# Patient Record
Sex: Male | Born: 1975 | Race: White | Hispanic: No | Marital: Single | Smoking: Never smoker
Health system: Southern US, Community
[De-identification: ages and names within clinical notes are randomized; demographics above are authoritative.]

## PROBLEM LIST (undated history)

## (undated) DIAGNOSIS — IMO0002 Reserved for concepts with insufficient information to code with codable children: Secondary | ICD-10-CM

## (undated) HISTORY — PX: APPENDECTOMY: SHX54

## (undated) HISTORY — PX: ABDOMINAL SURGERY: SHX537

## (undated) HISTORY — PX: COSMETIC SURGERY: SHX468

---

## 2005-11-21 HISTORY — PX: GASTRIC BYPASS: SHX52

## 2014-08-09 ENCOUNTER — Encounter (HOSPITAL_COMMUNITY): Payer: Self-pay | Admitting: Emergency Medicine

## 2014-08-09 ENCOUNTER — Observation Stay (HOSPITAL_COMMUNITY)
Admission: EM | Admit: 2014-08-09 | Discharge: 2014-08-11 | Disposition: A | Discharge status: Home / Self Care | Payer: BC Managed Care – PPO | Attending: General Surgery | Admitting: General Surgery

## 2014-08-09 ENCOUNTER — Emergency Department (HOSPITAL_COMMUNITY): Payer: BC Managed Care – PPO

## 2014-08-09 DIAGNOSIS — S2239XA Fracture of one rib, unspecified side, initial encounter for closed fracture: Secondary | ICD-10-CM | POA: Diagnosis present

## 2014-08-09 DIAGNOSIS — Z9884 Bariatric surgery status: Secondary | ICD-10-CM | POA: Insufficient documentation

## 2014-08-09 DIAGNOSIS — W108XXA Fall (on) (from) other stairs and steps, initial encounter: Secondary | ICD-10-CM | POA: Diagnosis not present

## 2014-08-09 DIAGNOSIS — S270XXA Traumatic pneumothorax, initial encounter: Secondary | ICD-10-CM | POA: Diagnosis not present

## 2014-08-09 DIAGNOSIS — S27329A Contusion of lung, unspecified, initial encounter: Secondary | ICD-10-CM

## 2014-08-09 DIAGNOSIS — G935 Compression of brain: Secondary | ICD-10-CM | POA: Insufficient documentation

## 2014-08-09 DIAGNOSIS — S2249XA Multiple fractures of ribs, unspecified side, initial encounter for closed fracture: Principal | ICD-10-CM | POA: Insufficient documentation

## 2014-08-09 DIAGNOSIS — S2231XA Fracture of one rib, right side, initial encounter for closed fracture: Secondary | ICD-10-CM

## 2014-08-09 DIAGNOSIS — Z23 Encounter for immunization: Secondary | ICD-10-CM | POA: Diagnosis not present

## 2014-08-09 DIAGNOSIS — J939 Pneumothorax, unspecified: Secondary | ICD-10-CM

## 2014-08-09 DIAGNOSIS — W19XXXA Unspecified fall, initial encounter: Secondary | ICD-10-CM | POA: Diagnosis present

## 2014-08-09 DIAGNOSIS — Y92009 Unspecified place in unspecified non-institutional (private) residence as the place of occurrence of the external cause: Secondary | ICD-10-CM | POA: Insufficient documentation

## 2014-08-09 DIAGNOSIS — S2241XA Multiple fractures of ribs, right side, initial encounter for closed fracture: Secondary | ICD-10-CM | POA: Diagnosis present

## 2014-08-09 HISTORY — DX: Reserved for concepts with insufficient information to code with codable children: IMO0002

## 2014-08-09 LAB — CBC WITH DIFFERENTIAL/PLATELET
Basophils Absolute: 0 10*3/uL (ref 0.0–0.1)
Basophils Relative: 0 % (ref 0–1)
Eosinophils Absolute: 0 10*3/uL (ref 0.0–0.7)
Eosinophils Relative: 0 % (ref 0–5)
HCT: 45.3 % (ref 39.0–52.0)
HEMOGLOBIN: 15.5 g/dL (ref 13.0–17.0)
LYMPHS ABS: 0.7 10*3/uL (ref 0.7–4.0)
LYMPHS PCT: 6 % — AB (ref 12–46)
MCH: 30.9 pg (ref 26.0–34.0)
MCHC: 34.2 g/dL (ref 30.0–36.0)
MCV: 90.4 fL (ref 78.0–100.0)
MONOS PCT: 5 % (ref 3–12)
Monocytes Absolute: 0.6 10*3/uL (ref 0.1–1.0)
NEUTROS ABS: 9.7 10*3/uL — AB (ref 1.7–7.7)
NEUTROS PCT: 89 % — AB (ref 43–77)
PLATELETS: 195 10*3/uL (ref 150–400)
RBC: 5.01 MIL/uL (ref 4.22–5.81)
RDW: 13.4 % (ref 11.5–15.5)
WBC: 11 10*3/uL — AB (ref 4.0–10.5)

## 2014-08-09 LAB — BASIC METABOLIC PANEL
Anion gap: 15 (ref 5–15)
BUN: 12 mg/dL (ref 6–23)
CHLORIDE: 95 meq/L — AB (ref 96–112)
CO2: 25 mEq/L (ref 19–32)
Calcium: 9.1 mg/dL (ref 8.4–10.5)
Creatinine, Ser: 0.7 mg/dL (ref 0.50–1.35)
GFR calc Af Amer: >90 mL/min (ref >90)
GFR calc non Af Amer: >90 mL/min (ref >90)
GLUCOSE: 131 mg/dL — AB (ref 70–99)
POTASSIUM: 3.9 meq/L (ref 3.7–5.3)
Sodium: 135 mEq/L — ABNORMAL LOW (ref 137–147)

## 2014-08-09 MED ORDER — HYDROMORPHONE HCL 1 MG/ML IJ SOLN
1.0000 mg | INTRAMUSCULAR | Status: DC | PRN
  Administered 2014-08-09 – 2014-08-10 (×3): 1 mg via INTRAVENOUS
  Filled 2014-08-09 (×3): qty 1

## 2014-08-09 MED ORDER — IOHEXOL 300 MG/ML  SOLN
100.0000 mL | Freq: Once | INTRAMUSCULAR | Status: AC | PRN
  Administered 2014-08-09: 100 mL via INTRAVENOUS

## 2014-08-09 MED ORDER — HYDROMORPHONE HCL 1 MG/ML IJ SOLN
0.5000 mg | INTRAMUSCULAR | Status: DC | PRN
  Administered 2014-08-09 – 2014-08-10 (×2): 0.5 mg via INTRAVENOUS
  Filled 2014-08-09 (×2): qty 1

## 2014-08-09 MED ORDER — MORPHINE SULFATE 4 MG/ML IJ SOLN
4.0000 mg | Freq: Once | INTRAMUSCULAR | Status: AC
  Administered 2014-08-09: 4 mg via INTRAVENOUS
  Filled 2014-08-09: qty 1

## 2014-08-09 MED ORDER — DOCUSATE SODIUM 100 MG PO CAPS
100.0000 mg | ORAL_CAPSULE | Freq: Two times a day (BID) | ORAL | Status: DC
  Administered 2014-08-09 – 2014-08-11 (×4): 100 mg via ORAL
  Filled 2014-08-09 (×4): qty 1

## 2014-08-09 MED ORDER — OXYCODONE HCL 5 MG PO TABS
5.0000 mg | ORAL_TABLET | ORAL | Status: DC | PRN
  Administered 2014-08-10: 10 mg via ORAL
  Filled 2014-08-09: qty 2

## 2014-08-09 MED ORDER — SODIUM CHLORIDE 0.9 % IV BOLUS (SEPSIS)
1000.0000 mL | Freq: Once | INTRAVENOUS | Status: AC
  Administered 2014-08-09: 1000 mL via INTRAVENOUS

## 2014-08-09 MED ORDER — ENOXAPARIN SODIUM 40 MG/0.4ML ~~LOC~~ SOLN
40.0000 mg | SUBCUTANEOUS | Status: DC
  Administered 2014-08-09: 40 mg via SUBCUTANEOUS
  Filled 2014-08-09 (×2): qty 0.4

## 2014-08-09 MED ORDER — ONDANSETRON HCL 4 MG/2ML IJ SOLN: 4.0000 mg | Freq: Four times a day (QID) | INTRAMUSCULAR | Status: DC | PRN

## 2014-08-09 MED ORDER — ONDANSETRON HCL 4 MG PO TABS: 4.0000 mg | ORAL_TABLET | Freq: Four times a day (QID) | ORAL | Status: DC | PRN

## 2014-08-09 MED ORDER — HYDROMORPHONE HCL 1 MG/ML IJ SOLN
1.0000 mg | Freq: Once | INTRAMUSCULAR | Status: AC
  Administered 2014-08-09: 1 mg via INTRAVENOUS
  Filled 2014-08-09: qty 1

## 2014-08-09 MED ORDER — OXYCODONE-ACETAMINOPHEN 5-325 MG PO TABS
2.0000 | ORAL_TABLET | ORAL | Status: DC | PRN
  Administered 2014-08-09 – 2014-08-10 (×3): 2 via ORAL
  Filled 2014-08-09 (×3): qty 2

## 2014-08-09 MED ORDER — HYDROMORPHONE HCL 1 MG/ML IJ SOLN
0.5000 mg | Freq: Once | INTRAMUSCULAR | Status: AC
  Administered 2014-08-09: 0.5 mg via INTRAVENOUS
  Filled 2014-08-09: qty 1

## 2014-08-09 NOTE — ED Provider Notes (Signed)
CSN: [id number]     Arrival date & time 08/09/14  1025 History   First MD Initiated Contact with Patient 08/09/14 1139     Chief Complaint  Patient presents with  . Rib Injury     (Consider location/radiation/quality/duration/timing/severity/associated sxs/prior Treatment) HPI Comments: The patient is a 38 year old male past medical history of Chiari malformation, gastric bypass presenting to emergency room from outpatient ortho clinic with a chief complaint of right lower rib pain since last night. Outside x-rays show multiple rib fractures. The patient reports he tripped while walking down the stairs and fell on to the steps on his right side at approximately 2330 last night. He denies blow to head or loss of consciousness. Denies abdominal pain, hematuria.  He reports movement and deep breathing increase pain. Reports shortness of breath with movement.  The history is provided by the patient. No language interpreter was used.    Past Medical History  Diagnosis Date  . Chiari malformation    Past Surgical History  Procedure Laterality Date  . Abdominal surgery     History reviewed. No pertinent family history. History  Substance Use Topics  . Smoking status: Never Smoker   . Smokeless tobacco: Not on file  . Alcohol Use: Yes    Review of Systems  Constitutional: Negative for fever and chills.  Respiratory: Positive for shortness of breath.   Cardiovascular: Positive for chest pain.  Gastrointestinal: Negative for vomiting and abdominal pain.  Genitourinary: Negative for hematuria.  Neurological: Negative for syncope.      Allergies  Review of patient's allergies indicates no known allergies.  Home Medications   Prior to Admission medications   Not on File   BP 133/82  Pulse 61  Temp(Src) 98.1 F (36.7 C) (Oral)  Resp 19  Ht 6' (1.829 m)  Wt 210 lb (95.255 kg)  BMI 28.47 kg/m2  SpO2 95% Physical Exam  Nursing note and vitals reviewed. Constitutional: He  is oriented to person, place, and time. He appears well-developed and well-nourished.  HENT:  Head: Normocephalic and atraumatic.  Eyes: EOM are normal.  Neck: Neck supple.  Cardiovascular: Normal rate and regular rhythm.   Pulmonary/Chest: No respiratory distress. He has decreased breath sounds in the right lower field. He has no wheezes. He has no rhonchi. He exhibits tenderness and bony tenderness.    Patient is able to speak in complete sentences.   Abdominal: Soft. There is no tenderness. There is no rebound and no guarding.  Musculoskeletal: Normal range of motion.  Neurological: He is alert and oriented to person, place, and time.  Skin: Skin is warm and dry. He is not diaphoretic.  Small ecchymosis to right lower extremity.   Psychiatric: He has a normal mood and affect. His behavior is normal.    ED Course  Procedures (including critical care time) Labs Review Labs Reviewed  CBC WITH DIFFERENTIAL - Abnormal; Notable for the following:    WBC 11.0 (*)    Neutrophils Relative % 89 (*)    Neutro Abs 9.7 (*)    Lymphocytes Relative 6 (*)    All other components within normal limits  BASIC METABOLIC PANEL - Abnormal; Notable for the following:    Sodium 135 (*)    Chloride 95 (*)    Glucose, Bld 131 (*)    All other components within normal limits    Imaging Review Ct Chest W Contrast  08/09/2014   CLINICAL DATA:  Fall.  Right chest wall pain.  Difficulty  breathing.  EXAM: CT CHEST WITH CONTRAST  TECHNIQUE: Multidetector CT imaging of the chest was performed during intravenous contrast administration.  CONTRAST:  170mL OMNIPAQUE IOHEXOL 300 MG/ML  SOLN  COMPARISON:  None.  FINDINGS: There are displaced fractures of the right sixth, seventh and eighth ribs. The sixth rib fracture is lateral, displaced 1 shaft width and overlapped by 13 mm. The seventh rib fracture is displaced by 1 shaft with and overlapped by 2 cm. It is also lateral. The eighth rib fracture is posterior  lateral, displaced 1 full shaft with and only slightly overlapped, by 5 mm. The medial fracture components are displaced centrally indenting the underlying lung. There are additional fractures of the posterior medial seventh and eighth ribs, nondisplaced.  There is a small pneumothorax, approximate 10%. There is patchy opacity in the right lower lobe consistent with a combination of pulmonary contusion and atelectasis. A small right pleural effusion is noted dependently and along the posterior hemidiaphragmatic surface.  No other fractures. Left lung is clear. There is no left pneumothorax or pleural effusion.  Heart is normal in size and configuration. Great vessels are normal in caliber. No mediastinal mass or adenopathy. No hematoma. No hilar masses or adenopathy.  No neck base or axillary masses or adenopathy.  Limited evaluation of the upper abdomen shows no evidence of a liver laceration or contusion. There are changes from previous gastric stapling surgery.  Small amount of subcutaneous air extends into the soft tissues below the chest wall musculature adjacent to the rib fractures.  IMPRESSION: 1. Displaced right sixth, seventh and eighth rib fractures as described associated with contusion and atelectasis of portions of the right lower lobe, a small effusion and a small pneumothorax. 2. No other acute, traumatic findings.   Electronically Signed   By: Lajean Manes M.D.   On: 08/09/2014 14:21     EKG Interpretation None      MDM   Final diagnoses:  Traumatic closed displaced fracture of rib on right side, initial encounter  Lung contusion, initial encounter  Pneumothorax on right   Patient sent by orthopedist for further evaluation of multiple rib fractures, outside x-rays show 3) fractures, no obvious lung injury, questionable subcutaneous air. Dr. Alvino Chapel also evaluated the patient during this encounter and advises CT of the chest. CT shows 3 displaced rib fractures,  with associated  small pneumothorax proximally 10% and pulmonary contusion with atelectasis. A small right pleural effusion also noted.  Paged trauma. Reevaluation patient reports 5/10 discomfort if sitting still. Discuss CT findings and advises admission for pain control and observation. Patient agrees to admission. 3:25 PM Discussed patient history, condition, CT with Dr. Brantley Stage, who agrees with admission and will have a trauma PA evaluate the patient. Meds given in ED:  Medications  morphine 4 MG/ML injection 4 mg (4 mg Intravenous Given 08/09/14 1220)  sodium chloride 0.9 % bolus 1,000 mL (0 mLs Intravenous Stopped 08/09/14 1413)  HYDROmorphone (DILAUDID) injection 0.5 mg (0.5 mg Intravenous Given 08/09/14 1325)  iohexol (OMNIPAQUE) 300 MG/ML solution 100 mL (100 mLs Intravenous Contrast Given 08/09/14 1352)  HYDROmorphone (DILAUDID) injection 1 mg (1 mg Intravenous Given 08/09/14 1414)    New Prescriptions   No medications on file    Harvie Heck, PA-C 08/09/14 1535

## 2014-08-09 NOTE — ED Notes (Signed)
Pt returned from CT °

## 2014-08-09 NOTE — ED Notes (Addendum)
He fell last night over a  Bucket in garage. hes had R sided rib pain since and [facility] did CXR in office today. They sent him for further eval of 3 broken ribs on cxr. He states hes had some SOB and it hurts to breathe since the injury. He has CXR on cd with him

## 2014-08-09 NOTE — ED Notes (Signed)
Trauma at bedside.

## 2014-08-09 NOTE — H&P (Signed)
Tanner Carroll is an 38 y.o. male.   Chief Complaint: Fall HPI: Tanner Carroll has imbalance 2/2 Chiari I malformation and fell down some stairs into the garage striking his right side on a parked vehicle. There was no loss of consciousness. He put up with the pain until this morning when he had his aunt and uncle bring him to urgent care. A chest x-ray showed rib fractures and he was sent to the ED.  Past Medical History  Diagnosis Date  . Chiari malformation     Past Surgical History  Procedure Laterality Date  . Abdominal surgery      History reviewed. No pertinent family history. Social History:  reports that he has never smoked. He does not have any smokeless tobacco history on file. He reports that he drinks alcohol. He reports that he does not use illicit drugs.  Allergies:  Allergies  Allergen Reactions  . Ibuprofen Other (See Comments)    Gastric bypass surgery, cannot have  . Nsaids Other (See Comments)    Gastric bypass surgery, cannot have    Results for orders placed during the hospital encounter of 08/09/14 (from the past 48 hour(s))  CBC WITH DIFFERENTIAL     Status: Abnormal   Collection Time    08/09/14 12:31 PM      Result Value Ref Range   WBC 11.0 (*) 4.0 - 10.5 K/uL   RBC 5.01  4.22 - 5.81 MIL/uL   Hemoglobin 15.5  13.0 - 17.0 g/dL   HCT 45.3  39.0 - 52.0 %   MCV 90.4  78.0 - 100.0 fL   MCH 30.9  26.0 - 34.0 pg   MCHC 34.2  30.0 - 36.0 g/dL   RDW 13.4  11.5 - 15.5 %   Platelets 195  150 - 400 K/uL   Neutrophils Relative % 89 (*) 43 - 77 %   Neutro Abs 9.7 (*) 1.7 - 7.7 K/uL   Lymphocytes Relative 6 (*) 12 - 46 %   Lymphs Abs 0.7  0.7 - 4.0 K/uL   Monocytes Relative 5  3 - 12 %   Monocytes Absolute 0.6  0.1 - 1.0 K/uL   Eosinophils Relative 0  0 - 5 %   Eosinophils Absolute 0.0  0.0 - 0.7 K/uL   Basophils Relative 0  0 - 1 %   Basophils Absolute 0.0  0.0 - 0.1 K/uL  BASIC METABOLIC PANEL     Status: Abnormal   Collection Time    08/09/14 12:31 PM       Result Value Ref Range   Sodium 135 (*) 137 - 147 mEq/L   Potassium 3.9  3.7 - 5.3 mEq/L   Chloride 95 (*) 96 - 112 mEq/L   CO2 25  19 - 32 mEq/L   Glucose, Bld 131 (*) 70 - 99 mg/dL   BUN 12  6 - 23 mg/dL   Creatinine, Ser 0.70  0.50 - 1.35 mg/dL   Calcium 9.1  8.4 - 10.5 mg/dL   GFR calc non Af Amer >90  >90 mL/min   GFR calc Af Amer >90  >90 mL/min   Comment: (NOTE)     The eGFR has been calculated using the CKD EPI equation.     This calculation has not been validated in all clinical situations.     eGFR's persistently <90 mL/min signify possible Chronic Kidney     Disease.   Anion gap 15  5 - 15   Ct Chest W Contrast  08/09/2014   CLINICAL DATA:  Fall.  Right chest wall pain.  Difficulty breathing.  EXAM: CT CHEST WITH CONTRAST  TECHNIQUE: Multidetector CT imaging of the chest was performed during intravenous contrast administration.  CONTRAST:  177m OMNIPAQUE IOHEXOL 300 MG/ML  SOLN  COMPARISON:  None.  FINDINGS: There are displaced fractures of the right sixth, seventh and eighth ribs. The sixth rib fracture is lateral, displaced 1 shaft width and overlapped by 13 mm. The seventh rib fracture is displaced by 1 shaft with and overlapped by 2 cm. It is also lateral. The eighth rib fracture is posterior lateral, displaced 1 full shaft with and only slightly overlapped, by 5 mm. The medial fracture components are displaced centrally indenting the underlying lung. There are additional fractures of the posterior medial seventh and eighth ribs, nondisplaced.  There is a small pneumothorax, approximate 10%. There is patchy opacity in the right lower lobe consistent with a combination of pulmonary contusion and atelectasis. A small right pleural effusion is noted dependently and along the posterior hemidiaphragmatic surface.  No other fractures. Left lung is clear. There is no left pneumothorax or pleural effusion.  Heart is normal in size and configuration. Great vessels are normal in caliber. No  mediastinal mass or adenopathy. No hematoma. No hilar masses or adenopathy.  No neck base or axillary masses or adenopathy.  Limited evaluation of the upper abdomen shows no evidence of a liver laceration or contusion. There are changes from previous gastric stapling surgery.  Small amount of subcutaneous air extends into the soft tissues below the chest wall musculature adjacent to the rib fractures.  IMPRESSION: 1. Displaced right sixth, seventh and eighth rib fractures as described associated with contusion and atelectasis of portions of the right lower lobe, a small effusion and a small pneumothorax. 2. No other acute, traumatic findings.   Electronically Signed   By: DLajean ManesM.D.   On: 08/09/2014 14:21    Review of Systems  Constitutional: Negative for weight loss.  HENT: Negative for ear discharge, ear pain, hearing loss and tinnitus.   Eyes: Negative for blurred vision, double vision, photophobia and pain.  Respiratory: Positive for shortness of breath. Negative for cough and sputum production.   Cardiovascular: Positive for chest pain.  Gastrointestinal: Negative for nausea, vomiting and abdominal pain.  Genitourinary: Negative for dysuria, urgency, frequency and flank pain.  Musculoskeletal: Negative for back pain, falls, joint pain, myalgias and neck pain.  Neurological: Negative for dizziness, tingling, sensory change, focal weakness, loss of consciousness and headaches.  Endo/Heme/Allergies: Does not bruise/bleed easily.  Psychiatric/Behavioral: Negative for depression, memory loss and substance abuse. The patient is not nervous/anxious.     Blood pressure 132/85, pulse 79, temperature 98.1 F (36.7 C), temperature source Oral, resp. rate 20, height 6' (1.829 m), weight 210 lb (95.255 kg), SpO2 97.00%. Physical Exam  Vitals reviewed. Constitutional: He is oriented to person, place, and time. He appears well-developed and well-nourished. He is cooperative. No distress. Cervical  collar and nasal cannula in place.  HENT:  Head: Normocephalic and atraumatic. Head is without raccoon's eyes, without Battle's sign, without abrasion, without contusion and without laceration.  Right Ear: Hearing, tympanic membrane, external ear and ear canal normal. No lacerations. No drainage or tenderness. No foreign bodies. Tympanic membrane is not perforated. No hemotympanum.  Left Ear: Hearing, tympanic membrane, external ear and ear canal normal. No lacerations. No drainage or tenderness. No foreign bodies. Tympanic membrane is not perforated. No hemotympanum.  Nose: Nose normal.  No nose lacerations, sinus tenderness, nasal deformity or nasal septal hematoma. No epistaxis.  Mouth/Throat: Uvula is midline, oropharynx is clear and moist and mucous membranes are normal. No lacerations. No oropharyngeal exudate.  Eyes: Conjunctivae, EOM and lids are normal. Pupils are equal, round, and reactive to light. Right eye exhibits no discharge. Left eye exhibits no discharge. No scleral icterus.  Neck: Trachea normal and normal range of motion. Neck supple. No JVD present. No spinous process tenderness and no muscular tenderness present. Carotid bruit is not present. No tracheal deviation present. No thyromegaly present.  Cardiovascular: Normal rate, regular rhythm, normal heart sounds, intact distal pulses and normal pulses.  Exam reveals no gallop and no friction rub.   No murmur heard. Respiratory: Effort normal and breath sounds normal. No stridor. No respiratory distress. He has no wheezes. He has no rales. He exhibits tenderness. He exhibits no bony tenderness, no laceration and no crepitus.  GI: Soft. Normal appearance and bowel sounds are normal. He exhibits no distension. There is no tenderness. There is no rigidity, no rebound, no guarding and no CVA tenderness.  Musculoskeletal: Normal range of motion. He exhibits no edema and no tenderness.  Lymphadenopathy:    He has no cervical adenopathy.   Neurological: He is alert and oriented to person, place, and time. He has normal strength. No cranial nerve deficit or sensory deficit. GCS eye subscore is 4. GCS verbal subscore is 5. GCS motor subscore is 6.  Skin: Skin is warm, dry and intact. He is not diaphoretic.  Psychiatric: He has a normal mood and affect. His speech is normal and behavior is normal.     Assessment/Plan Fall Multiple right rib fxs w/PTX -- Pulmonary toilet  Admit to trauma    Lisette Abu, PA-C Pager: [phone] General Trauma PA Pager: [phone] 08/09/2014, 4:11 PM

## 2014-08-09 NOTE — H&P (Signed)
Pt with small right PTX and no SOB.  Admit for 24 hours and repeat CXR in am.

## 2014-08-10 ENCOUNTER — Observation Stay (HOSPITAL_COMMUNITY): Payer: BC Managed Care – PPO

## 2014-08-10 DIAGNOSIS — W19XXXA Unspecified fall, initial encounter: Secondary | ICD-10-CM | POA: Diagnosis present

## 2014-08-10 DIAGNOSIS — S2241XA Multiple fractures of ribs, right side, initial encounter for closed fracture: Secondary | ICD-10-CM | POA: Diagnosis present

## 2014-08-10 DIAGNOSIS — G935 Compression of brain: Secondary | ICD-10-CM | POA: Diagnosis present

## 2014-08-10 MED ORDER — INFLUENZA VAC SPLIT QUAD 0.5 ML IM SUSY
0.5000 mL | PREFILLED_SYRINGE | INTRAMUSCULAR | Status: AC
  Administered 2014-08-11: 0.5 mL via INTRAMUSCULAR
  Filled 2014-08-10: qty 0.5

## 2014-08-10 MED ORDER — OXYCODONE HCL 5 MG PO TABS
10.0000 mg | ORAL_TABLET | ORAL | Status: DC | PRN
  Administered 2014-08-10 – 2014-08-11 (×4): 10 mg via ORAL
  Filled 2014-08-10 (×4): qty 2

## 2014-08-10 MED ORDER — HYDROMORPHONE HCL 1 MG/ML IJ SOLN
1.0000 mg | INTRAMUSCULAR | Status: DC | PRN
  Administered 2014-08-11: 1 mg via INTRAVENOUS
  Filled 2014-08-10: qty 1

## 2014-08-10 MED ORDER — TRAMADOL HCL 50 MG PO TABS
100.0000 mg | ORAL_TABLET | Freq: Four times a day (QID) | ORAL | Status: DC
  Administered 2014-08-10 – 2014-08-11 (×5): 100 mg via ORAL
  Filled 2014-08-10 (×5): qty 2

## 2014-08-10 MED ORDER — POLYETHYLENE GLYCOL 3350 17 G PO PACK
17.0000 g | PACK | Freq: Every day | ORAL | Status: DC
  Administered 2014-08-10: 17 g via ORAL
  Filled 2014-08-10 (×3): qty 1

## 2014-08-10 MED ORDER — METHOCARBAMOL 1000 MG/10ML IJ SOLN
1000.0000 mg | Freq: Four times a day (QID) | INTRAVENOUS | Status: DC | PRN
  Administered 2014-08-11 (×3): 1000 mg via INTRAVENOUS
  Filled 2014-08-10 (×4): qty 10

## 2014-08-10 MED ORDER — ENOXAPARIN SODIUM 30 MG/0.3ML ~~LOC~~ SOLN
30.0000 mg | Freq: Two times a day (BID) | SUBCUTANEOUS | Status: DC
  Administered 2014-08-10 – 2014-08-11 (×3): 30 mg via SUBCUTANEOUS
  Filled 2014-08-10 (×4): qty 0.3

## 2014-08-10 MED ORDER — PNEUMOCOCCAL VAC POLYVALENT 25 MCG/0.5ML IJ INJ
0.5000 mL | INJECTION | INTRAMUSCULAR | Status: AC
  Administered 2014-08-11: 0.5 mL via INTRAMUSCULAR
  Filled 2014-08-10: qty 0.5

## 2014-08-10 NOTE — Progress Notes (Signed)
Patient doing okay.  Has 10-15% PTX on CXR today.  Will repeat tomorrow AM.\ \ This patient has been seen and I agree with the findings and treatment plan.  Kathryne Eriksson. Dahlia Bailiff, MD, [place] [phone] (pager) [phone] (direct pager) Trauma Surgeon

## 2014-08-10 NOTE — Evaluation (Signed)
Physical Therapy Evaluation Patient Details Name: Tanner Carroll MRN: [id number] DOB: [birth year] Today's Date: 08/10/2014   History of Present Illness  38 y.o. male with imbalance 2/2 Chiari I malformation who fell coming down stairs sustaining R rib fxs and traumatic pneumothorax.  Clinical Impression  Pt admitted with above dx.  On eval, he is mod I for bed mobility and transfers.  Min guard assist needed for ambulation without A.D. 350.  No skilled PT intervention indicated.  Recommend pt ambulate in hallway with nursing assist.  PT signing off.    Follow Up Recommendations No PT follow up    Equipment Recommendations  None recommended by PT    Recommendations for Other Services       Precautions / Restrictions        Mobility  Bed Mobility Overal bed mobility: Modified Independent                Transfers Overall transfer level: Modified independent Equipment used: None                Ambulation/Gait Ambulation/Gait assistance: Min guard Ambulation Distance (Feet): 350 Feet Assistive device: None Gait Pattern/deviations: Decreased stride length;Step-through pattern Gait velocity: decreased Gait velocity interpretation: Below normal speed for age/gender General Gait Details: Pt requires focuses on object in front of him to maintain balance during ambulation.  He is independent with strategies needed to accomedate for balance deficits related to imbalance 2/2 Chiari I malformation.  Stairs Stairs: Yes Stairs assistance: Min guard Stair Management: One rail Right;Forwards;Alternating pattern Number of Stairs: 5 (x 2)    Wheelchair Mobility    Modified Rankin (Stroke Patients Only)       Balance                                             Pertinent Vitals/Pain Pain Assessment: 0-10 Pain Score: 4  Pain Location: R flank Pain Intervention(s): Monitored during session;Premedicated before session    Home Living Family/patient  expects to be discharged to:: Private residence Living Arrangements: Other relatives Available Help at Discharge: Family;Available 24 hours/day Type of Home: House Home Access: Stairs to enter Entrance Stairs-Rails: Right Entrance Stairs-Number of Steps: 5 Home Layout: Two level;Bed/bath upstairs Home Equipment: None      Prior Function Level of Independence: Independent               Hand Dominance        Extremity/Trunk Assessment   Upper Extremity Assessment: Overall WFL for tasks assessed           Lower Extremity Assessment: Overall WFL for tasks assessed      Cervical / Trunk Assessment: Normal  Communication   Communication: No difficulties  Cognition Arousal/Alertness: Awake/alert Behavior During Therapy: WFL for tasks assessed/performed Overall Cognitive Status: Within Functional Limits for tasks assessed                      General Comments      Exercises        Assessment/Plan    PT Assessment Patent does not need any further PT services  PT Diagnosis     PT Problem List    PT Treatment Interventions     PT Goals (Current goals can be found in the Care Plan section) Acute Rehab PT Goals Patient Stated Goal: not stated PT Goal Formulation: No  goals set, d/c therapy    Frequency     Barriers to discharge        Co-evaluation               End of Session Equipment Utilized During Treatment: Gait belt Activity Tolerance: Patient tolerated treatment well Patient left: with call bell/phone within reach;with family/visitor present (in bathroom) Nurse Communication: Mobility status    Functional Assessment Tool Used: clinical judgement Functional Limitation: Mobility: Walking and moving around Mobility: Walking and Moving Around Current Status ([id number]): At least 1 percent but less than 20 percent impaired, limited or restricted Mobility: Walking and Moving Around Goal Status [phone]): At least 1 percent but less than 20  percent impaired, limited or restricted Mobility: Walking and Moving Around Discharge Status [id number]): At least 1 percent but less than 20 percent impaired, limited or restricted    Time: 0933-0947 PT Time Calculation (min): 14 min   Charges:   PT Evaluation $Initial PT Evaluation Tier I: 1 Procedure PT Treatments $Gait Training: 8-22 mins   PT G Codes:   Functional Assessment Tool Used: clinical judgement Functional Limitation: Mobility: Walking and moving around    Lorriane Shire 08/10/2014, 12:53 PM

## 2014-08-10 NOTE — Progress Notes (Signed)
Patient ID: Tanner Carroll, male   DOB: 24-Jul-1976, 38 y.o.   MRN: [id number]   LOS: 1 day   Subjective: Feeling better but not moving much. SOB when mobilizing.   Objective: Vital signs in last 24 hours: Temp:  [98.1 F (36.7 C)-98.5 F (36.9 C)] 98.1 F (36.7 C) (09/20 0626) Pulse Rate:  [56-93] 56 (09/20 0626) Resp:  [16-24] 18 (09/20 0626) BP: (121-147)/(56-85) 123/67 mmHg (09/20 0626) SpO2:  [83 %-100 %] 97 % (09/20 0626) Weight:  [210 lb (95.255 kg)] 210 lb (95.255 kg) (09/19 1035) Last BM Date: 08/08/14   Radiology Results PORTABLE CHEST - 1 VIEW  COMPARISON: CT chest yesterday.  FINDINGS:  Cardiomediastinal silhouette unremarkable, unchanged. Right apical  and lateral pneumothorax slightly increased since yesterday.  Displaced right rib fractures as detailed on the CT. Stable  atelectasis at the right lung base. No new pulmonary parenchymal  abnormalities.  IMPRESSION:  1. Slight interval increase in size of the right apical and lateral  pneumothorax since yesterday.  2. Stable right basilar atelectasis.  3. No new abnormalities.  Electronically Signed  By: Evangeline Dakin M.D.  On: 08/10/2014 08:56   Physical Exam General appearance: alert and no distress Resp: clear to auscultation bilaterally Cardio: regular rate and rhythm GI: normal findings: bowel sounds normal and soft, non-tender   Assessment/Plan: Fall Multiple right rib fxs w/PTX -- Though read as worse today's x-ray appears stable to slightly improved compared with CXR done at [facility] yesterday. FEN -- Add tramadol VTE -- SCD's, Lovenox Dispo -- Will see how day goes with PT eval, may need 1 more day    Lisette Abu, PA-C Pager: [phone] General Trauma PA Pager: [phone]  08/10/2014

## 2014-08-10 NOTE — ED Provider Notes (Signed)
Medical screening examination/treatment/procedure(s) were conducted as a shared visit with non-physician practitioner(s) and myself.  I personally evaluated the patient during the encounter.   EKG Interpretation None     Patient with fall yesterday. Sent in with x-ray showing 3 rib fractures. A CT scan done which showed pneumothorax and pulmonary contusion. Patient as needed IV pain control. Will admit to trauma service and  Jasper Riling. Alvino Chapel, MD 08/10/14 [phone]

## 2014-08-10 NOTE — Progress Notes (Signed)
Utilization Review Completed.   Lilliemae Fruge, RN, BSN Nurse Case Manager  

## 2014-08-11 ENCOUNTER — Encounter (INDEPENDENT_AMBULATORY_CARE_PROVIDER_SITE_OTHER): Payer: Self-pay | Admitting: Orthopedic Surgery

## 2014-08-11 ENCOUNTER — Observation Stay (HOSPITAL_COMMUNITY): Payer: BC Managed Care – PPO

## 2014-08-11 MED ORDER — METHOCARBAMOL 500 MG PO TABS: ORAL_TABLET | ORAL | Status: DC

## 2014-08-11 MED ORDER — TRAMADOL HCL 50 MG PO TABS: 100.0000 mg | ORAL_TABLET | Freq: Four times a day (QID) | ORAL | Status: DC

## 2014-08-11 MED ORDER — OXYCODONE-ACETAMINOPHEN 10-325 MG PO TABS: 1.0000 | ORAL_TABLET | ORAL | Status: DC | PRN

## 2014-08-11 NOTE — Progress Notes (Signed)
D/c Patient examined and I agree with the assessment and plan  Georganna Skeans, MD, MPH, FACS Trauma: [phone] General Surgery: [phone]  08/11/2014 10:50 AM

## 2014-08-11 NOTE — Progress Notes (Signed)
Patient awaiting discharge upon walking into room. IV removed from left AC. Patient provided with discharge paper work and prescriptions. He is going home in private vehicle at this time.

## 2014-08-11 NOTE — Discharge Summary (Signed)
Benedetta Sundstrom, MD, MPH, FACS Trauma: [phone] General Surgery: [phone]  

## 2014-08-11 NOTE — Discharge Instructions (Signed)
Increase activity as pain allows. ° °No driving while taking oxycodone. °

## 2014-08-11 NOTE — Discharge Summary (Signed)
Physician Discharge Summary  Patient ID: Tanner Carroll MRN: [id number] DOB/AGE: 1976-07-29 38 y.o.  Admit date: 08/09/2014 Discharge date: 08/11/2014  Discharge Diagnoses Patient Active Problem List   Diagnosis Date Noted  . Fall 08/10/2014  . Fracture of multiple ribs of right side 08/10/2014  . Chiari malformation type I 08/10/2014  . Traumatic pneumothorax 08/09/2014    Consultants None   Procedures None   HPI: Tanner Carroll has imbalance secondary to a Chiari I malformation and fell down some stairs into the garage striking his right side on a parked vehicle. There was no loss of consciousness. He put up with the pain until morning when he had his aunt and uncle bring him to urgent care. A chest x-ray showed rib fractures and he was sent to the ED. There a chest CT also showed a pneumothorax. He was admitted to the trauma service for observation of his pneumothorax and pulmonary toilet.   Hospital Course: The patient's pneumothorax remained stable while he was in the hospital. He needed multiple titrations of analgesics to get his pain under control. He was mobilized with physical therapy and did well. He was discharged home in good condition.      Medication List         methocarbamol 500 MG tablet  Commonly known as:  ROBAXIN  - 1500mg  po qid x4d then  - 1000mg  po q6h prn muscle spasm     oxyCODONE-acetaminophen 10-325 MG per tablet  Commonly known as:  PERCOCET  Take 1-2 tablets by mouth every 4 (four) hours as needed for pain.     traMADol 50 MG tablet  Commonly known as:  ULTRAM  Take 2 tablets (100 mg total) by mouth 4 (four) times daily.             Follow-up Information   Follow up with [facility] On 08/27/2014. (2:15PM -- Obtain chest x-ray before noon on day of appointment. Can be done a day or two earlier if more convenient)    Contact information:   [place] [phone]       Signed: Lisette Abu, PA-C Pager: [phone] General Trauma PA Pager: [phone] 08/11/2014, 9:39 AM

## 2014-08-11 NOTE — Progress Notes (Signed)
Patient ID: Tanner Carroll, male   DOB: 12-May-1976, 38 y.o.   MRN: [id number]   LOS: 2 days   Subjective: Having muscle spasms now but otherwise NSC.   Objective: Vital signs in last 24 hours: Temp:  [98.2 F (36.8 C)-98.9 F (37.2 C)] 98.2 F (36.8 C) (09/21 0602) Pulse Rate:  [71-84] 75 (09/21 0602) Resp:  [18] 18 (09/21 0602) BP: (123-134)/(75-84) 123/84 mmHg (09/21 0602) SpO2:  [96 %-98 %] 97 % (09/21 0602) Last BM Date: 08/08/14   Radiology Results CHEST 2 VIEW  COMPARISON: 08/10/2014  FINDINGS:  Enlargement of cardiac silhouette with slight pulmonary vascular  congestion.  Mediastinal contours normal.  RIGHT basilar atelectasis.  Small RIGHT pneumothorax little changed.  Remaining lungs clear.  No definite pleural effusion lung LEFT pneumothorax.  Again identified multiple RIGHT rib fractures.  IMPRESSION:  Persistent RIGHT pneumothorax and basilar atelectasis.  Electronically Signed  By: Lavonia Dana M.D.  On: 08/11/2014 07:56   Physical Exam General appearance: alert and mild distress Resp: clear to auscultation bilaterally Cardio: regular rate and rhythm GI: normal findings: bowel sounds normal and soft, non-tender   Assessment/Plan: Fall  Multiple right rib fxs w/PTX -- PTX unchanged Dispo -- Home today    Lisette Abu, PA-C Pager: [phone] General Trauma PA Pager: [phone]  08/11/2014

## 2014-08-14 ENCOUNTER — Encounter (HOSPITAL_COMMUNITY): Payer: Self-pay | Admitting: Emergency Medicine

## 2014-08-14 ENCOUNTER — Emergency Department (HOSPITAL_COMMUNITY)
Admission: EM | Admit: 2014-08-14 | Discharge: 2014-08-14 | Disposition: A | Discharge status: Home / Self Care | Payer: BC Managed Care – PPO | Attending: Emergency Medicine | Admitting: Emergency Medicine

## 2014-08-14 ENCOUNTER — Emergency Department (HOSPITAL_COMMUNITY): Payer: BC Managed Care – PPO

## 2014-08-14 DIAGNOSIS — R109 Unspecified abdominal pain: Secondary | ICD-10-CM | POA: Insufficient documentation

## 2014-08-14 DIAGNOSIS — K560 Paralytic ileus: Secondary | ICD-10-CM | POA: Diagnosis not present

## 2014-08-14 DIAGNOSIS — Z4789 Encounter for other orthopedic aftercare: Secondary | ICD-10-CM | POA: Insufficient documentation

## 2014-08-14 DIAGNOSIS — Z9889 Other specified postprocedural states: Secondary | ICD-10-CM | POA: Diagnosis not present

## 2014-08-14 DIAGNOSIS — Z79899 Other long term (current) drug therapy: Secondary | ICD-10-CM | POA: Insufficient documentation

## 2014-08-14 DIAGNOSIS — K567 Ileus, unspecified: Secondary | ICD-10-CM

## 2014-08-14 DIAGNOSIS — J90 Pleural effusion, not elsewhere classified: Secondary | ICD-10-CM | POA: Insufficient documentation

## 2014-08-14 DIAGNOSIS — S2231XD Fracture of one rib, right side, subsequent encounter for fracture with routine healing: Secondary | ICD-10-CM

## 2014-08-14 LAB — COMPREHENSIVE METABOLIC PANEL
ALT: 16 U/L (ref 0–53)
AST: 21 U/L (ref 0–37)
Albumin: 3.9 g/dL (ref 3.5–5.2)
Alkaline Phosphatase: 70 U/L (ref 39–117)
Anion gap: 13 (ref 5–15)
BUN: 8 mg/dL (ref 6–23)
CO2: 32 mEq/L (ref 19–32)
Calcium: 9.6 mg/dL (ref 8.4–10.5)
Chloride: 93 mEq/L — ABNORMAL LOW (ref 96–112)
Creatinine, Ser: 0.65 mg/dL (ref 0.50–1.35)
GFR calc Af Amer: >90 mL/min (ref >90)
GFR calc non Af Amer: >90 mL/min (ref >90)
Glucose, Bld: 106 mg/dL — ABNORMAL HIGH (ref 70–99)
Potassium: 4.4 mEq/L (ref 3.7–5.3)
Sodium: 138 mEq/L (ref 137–147)
Total Bilirubin: 0.6 mg/dL (ref 0.3–1.2)
Total Protein: 7.5 g/dL (ref 6.0–8.3)

## 2014-08-14 LAB — CBC WITH DIFFERENTIAL/PLATELET
Basophils Absolute: 0.1 10*3/uL (ref 0.0–0.1)
Basophils Relative: 1 % (ref 0–1)
Eosinophils Absolute: 0.5 10*3/uL (ref 0.0–0.7)
Eosinophils Relative: 7 % — ABNORMAL HIGH (ref 0–5)
HCT: 46.3 % (ref 39.0–52.0)
Hemoglobin: 15.6 g/dL (ref 13.0–17.0)
Lymphocytes Relative: 21 % (ref 12–46)
Lymphs Abs: 1.5 10*3/uL (ref 0.7–4.0)
MCH: 31.5 pg (ref 26.0–34.0)
MCHC: 33.7 g/dL (ref 30.0–36.0)
MCV: 93.5 fL (ref 78.0–100.0)
Monocytes Absolute: 1 10*3/uL (ref 0.1–1.0)
Monocytes Relative: 14 % — ABNORMAL HIGH (ref 3–12)
Neutro Abs: 4.1 10*3/uL (ref 1.7–7.7)
Neutrophils Relative %: 57 % (ref 43–77)
Platelets: 253 10*3/uL (ref 150–400)
RBC: 4.95 MIL/uL (ref 4.22–5.81)
RDW: 13 % (ref 11.5–15.5)
WBC: 7.1 10*3/uL (ref 4.0–10.5)

## 2014-08-14 LAB — URINALYSIS, ROUTINE W REFLEX MICROSCOPIC
Glucose, UA: NEGATIVE mg/dL
Hgb urine dipstick: NEGATIVE
Ketones, ur: 15 mg/dL — AB
Leukocytes, UA: NEGATIVE
Nitrite: NEGATIVE
Protein, ur: NEGATIVE mg/dL
Specific Gravity, Urine: 1.036 — ABNORMAL HIGH (ref 1.005–1.030)
Urobilinogen, UA: 1 mg/dL (ref 0.0–1.0)
pH: 5.5 (ref 5.0–8.0)

## 2014-08-14 MED ORDER — OXYCODONE-ACETAMINOPHEN 5-325 MG PO TABS
2.0000 | ORAL_TABLET | Freq: Once | ORAL | Status: DC
Start: 2014-08-14 — End: 2014-08-14

## 2014-08-14 MED ORDER — IOHEXOL 300 MG/ML  SOLN
100.0000 mL | Freq: Once | INTRAMUSCULAR | Status: AC | PRN
  Administered 2014-08-14: 100 mL via INTRAVENOUS

## 2014-08-14 MED ORDER — OXYCODONE-ACETAMINOPHEN 5-325 MG PO TABS
1.0000 | ORAL_TABLET | Freq: Once | ORAL | Status: AC
  Administered 2014-08-14: 1 via ORAL
  Filled 2014-08-14: qty 1

## 2014-08-14 NOTE — ED Notes (Signed)
Called CT to notify them that pt had finished contrast dye

## 2014-08-14 NOTE — ED Notes (Addendum)
Pt c/o constipation and abd pain, sts he was seen at PCP and had abd xray and was told he need to come to the ED for a CT of abd because there was a possible blockage or torsion. Pt reports recent fall, currently has broken rib, and started taking pain medicine which started the abd pain/constipation. Last at ate 1pm today. Denies n/v, able to keep food down easily. Reports also feeling bloated. Hx of gastric bypass. Nad, skin warm and dry, resp e/u.

## 2014-08-14 NOTE — ED Notes (Signed)
Pt aware of need for urine sample.  

## 2014-08-14 NOTE — Discharge Instructions (Signed)
Continue taking the Miralax.  Try to cut back on the Percocet.  Only take as needed for severe pain.

## 2014-08-14 NOTE — ED Provider Notes (Signed)
CSN: [id number]     Arrival date & time 08/14/14  1522 History   First MD Initiated Contact with Patient 08/14/14 1940     No chief complaint on file.    (Consider location/radiation/quality/duration/timing/severity/associated sxs/prior Treatment) HPI Comments: Patient who was recently admitted to Trauma Service for rib fractures and Pneumothorax presents today with a chief complaint of diffuse non radiating abdominal pain.  He reports that that the pain has been present for the past 2-3 days and is gradually worsening.  He also states that he has not had a normal bowel movement in the past week.  He has had three episodes of loose stool over the past few days with the last BM yesterday.  He denies nausea or vomiting.  Denies fever or chills.  He reports that he is currently taking Percocet and Tramadol for the pain associated with his rib fractures.  He reports that the pain medication controls his pain.  He is also currently taking Miralax twice a day.  He denies any new chest pain.  Denies SOB or cough.  Denies urinary symptoms.  Patient was seen by his PCP today and had an abdominal xray, which apparently showed possible obstruction.  He was therefore sent to the ED to obtain a CT of the abdomen to rule out obstruction.  The history is provided by the patient.    Past Medical History  Diagnosis Date  . Chiari malformation    Past Surgical History  Procedure Laterality Date  . Abdominal surgery    . Cosmetic surgery      body lift  . Gastric bypass  2007   No family history on file. History  Substance Use Topics  . Smoking status: Never Smoker   . Smokeless tobacco: Not on file  . Alcohol Use: Yes    Review of Systems  All other systems reviewed and are negative.     Allergies  Ibuprofen and Nsaids  Home Medications   Prior to Admission medications   Medication Sig Start Date End Date Taking? Authorizing Provider  methocarbamol (ROBAXIN) 500 MG tablet 1500mg  po qid x4d  then 1000mg  po q6h prn muscle spasm 08/11/14   Lisette Abu, PA-C  oxyCODONE-acetaminophen (PERCOCET) 10-325 MG per tablet Take 1-2 tablets by mouth every 4 (four) hours as needed for pain. 08/11/14   Lisette Abu, PA-C  traMADol (ULTRAM) 50 MG tablet Take 2 tablets (100 mg total) by mouth 4 (four) times daily. 08/11/14   Lisette Abu, PA-C   BP 159/100  Pulse 92  Temp(Src) 98.8 F (37.1 C) (Oral)  Resp 18  Ht 6\' 1"  (1.854 m)  Wt 214 lb (97.07 kg)  BMI 28.24 kg/m2  SpO2 95% Physical Exam  Nursing note and vitals reviewed. Constitutional: He appears well-developed and well-nourished.  HENT:  Head: Normocephalic and atraumatic.  Mouth/Throat: Oropharynx is clear and moist.  Neck: Normal range of motion. Neck supple.  Cardiovascular: Normal rate, regular rhythm and normal heart sounds.   Pulmonary/Chest: Effort normal and breath sounds normal.  Abdominal: Soft. Bowel sounds are normal. He exhibits distension. He exhibits no mass. There is tenderness. There is no rebound and no guarding.  Mild diffuse tenderness  Musculoskeletal: Normal range of motion.  Neurological: He is alert.  Skin: Skin is warm and dry.  Psychiatric: He has a normal mood and affect.    ED Course  Procedures (including critical care time) Labs Review Labs Reviewed  CBC WITH DIFFERENTIAL - Abnormal; Notable for the  following:    Monocytes Relative 14 (*)    Eosinophils Relative 7 (*)    All other components within normal limits  COMPREHENSIVE METABOLIC PANEL - Abnormal; Notable for the following:    Chloride 93 (*)    Glucose, Bld 106 (*)    All other components within normal limits  URINALYSIS, ROUTINE W REFLEX MICROSCOPIC - Abnormal; Notable for the following:    Color, Urine AMBER (*)    Specific Gravity, Urine 1.036 (*)    Bilirubin Urine SMALL (*)    Ketones, ur 15 (*)    All other components within normal limits    Imaging Review Ct Abdomen Pelvis W Contrast  08/14/2014    CLINICAL DATA:  Status post fall last Friday with known right rib fracture. Abdomen pain. No bowel movement in 3 days. Assess for possible torsion of bowel.  EXAM: CT ABDOMEN AND PELVIS WITH CONTRAST  TECHNIQUE: Multidetector CT imaging of the abdomen and pelvis was performed using the standard protocol following bolus administration of intravenous contrast.  CONTRAST:  167mL OMNIPAQUE IOHEXOL 300 MG/ML  SOLN  COMPARISON:  CT chest August 09, 2014  FINDINGS: There is distension of the right colon and right-sided transverse colon. There is fluid and air are identified within the colon. There is no evidence of colonic torsion. The small bowel loops are normal without evidence of small bowel obstruction. The appendix is not seen.  The liver, spleen, pancreas, gallbladder, adrenal glands and kidneys are normal. There is minimal right perinephric stranding which is probably related to the patient's trauma last week. The aorta is normal caliber. There is no abdominal lymphadenopathy.  Partial fluid-filled bladder is normal. Minimal free fluid is identified in the pelvis.  Images of the lung bases demonstrate a moderate right pleural effusion with compression atelectasis of the posterior right lung base. The previously noted right rib fractures are again identified. Degenerative joint changes of the spine are noted.  IMPRESSION: Marked distended right colon and right-sided transverse colon. There is no evidence of colonic torsion. The findings may be due to a dynamic ileus from pain medication treatment.  Moderate right pleural effusion with multiple right rib fractures.   Electronically Signed   By: Abelardo Diesel M.D.   On: 08/14/2014 18:07     EKG Interpretation None      MDM   Final diagnoses:  None   Patient who was recently hospitalized for rib fractures currently on Percocet and Tramadol presents today with generalized abdominal pain.  CT abdomen/pelvis showing dynamic ileus, but no evidence of  obstruction or colonic torsion.  Labs unremarkable.  Patient has been able to tolerate eating food and drinking fluid.  No vomiting.  Patient reports passing a small amount of liquid stool.  Feel that the patient is stable for discharge.  Return precautions given.    Hyman Bible, PA-C 08/16/14 Acacia Villas, PA-C 08/16/14 1119

## 2014-08-14 NOTE — ED Notes (Signed)
Pt is in a lot of pain asking for pain meds. He told me he has his own with him I told him please do not take his own I'll let his RN now he needs meds.

## 2014-08-14 NOTE — ED Notes (Signed)
Pt ambulating independently w/ steady gait on d/c in no acute distress, A&Ox4.D/c instructions reviewed w/ pt and family - pt and family deny any further questions or concerns at present.  

## 2014-08-16 NOTE — ED Provider Notes (Signed)
Medical screening examination/treatment/procedure(s) were performed by non-physician practitioner and as supervising physician I was immediately available for consultation/collaboration.   EKG Interpretation None       Virgel Manifold, MD 08/16/14 1501

## 2014-08-18 ENCOUNTER — Telehealth (HOSPITAL_COMMUNITY): Payer: Self-pay

## 2014-08-19 MED ORDER — OXYCODONE-ACETAMINOPHEN 10-325 MG PO TABS: 1.0000 | ORAL_TABLET | ORAL | Status: DC | PRN

## 2014-08-19 NOTE — Telephone Encounter (Signed)
Refilled rx for Percocet 10/325, #20.

## 2014-08-26 ENCOUNTER — Ambulatory Visit (HOSPITAL_COMMUNITY)
Admission: RE | Admit: 2014-08-26 | Discharge: 2014-08-26 | Disposition: A | Discharge status: Home / Self Care | Payer: BC Managed Care – PPO | Source: Ambulatory Visit | Attending: Emergency Medicine | Admitting: Emergency Medicine

## 2014-08-26 ENCOUNTER — Other Ambulatory Visit (INDEPENDENT_AMBULATORY_CARE_PROVIDER_SITE_OTHER): Payer: Self-pay | Admitting: Surgery

## 2014-08-26 ENCOUNTER — Other Ambulatory Visit (HOSPITAL_COMMUNITY): Payer: Self-pay | Admitting: Emergency Medicine

## 2014-08-26 ENCOUNTER — Other Ambulatory Visit (INDEPENDENT_AMBULATORY_CARE_PROVIDER_SITE_OTHER): Payer: Self-pay | Admitting: General Surgery

## 2014-08-26 DIAGNOSIS — R52 Pain, unspecified: Secondary | ICD-10-CM

## 2014-08-26 DIAGNOSIS — J939 Pneumothorax, unspecified: Secondary | ICD-10-CM | POA: Diagnosis present

## 2014-08-26 DIAGNOSIS — J90 Pleural effusion, not elsewhere classified: Secondary | ICD-10-CM | POA: Insufficient documentation

## 2014-09-01 ENCOUNTER — Other Ambulatory Visit (INDEPENDENT_AMBULATORY_CARE_PROVIDER_SITE_OTHER): Payer: Self-pay | Admitting: General Surgery

## 2014-09-01 ENCOUNTER — Telehealth (HOSPITAL_COMMUNITY): Payer: Self-pay | Admitting: Emergency Medicine

## 2014-09-01 MED ORDER — TRAMADOL HCL 50 MG PO TABS: 100.0000 mg | ORAL_TABLET | Freq: Four times a day (QID) | ORAL | Status: DC | PRN

## 2014-09-01 NOTE — Telephone Encounter (Signed)
Spoke with Pt.  Rx written.  Will place in trauma office.  Pt will pick up tomorrow.  Kameren Baade, ANP-BC

## 2015-07-20 IMAGING — CT CT ABD-PELV W/ CM
2 of 5 series · 11 of 46 positions shown, 12 images · IV contrast (Iodine)
Comparison: CT chest August 09, 2014

CLINICAL DATA: Status post fall [REDACTED] with known right rib
fracture. Abdomen pain. No bowel movement in 3 days. Assess for
possible torsion of bowel.

EXAM:
CT ABDOMEN AND PELVIS WITH CONTRAST
TECHNIQUE: Multidetector CT imaging of the abdomen and pelvis was performed
using the standard protocol following bolus administration of
intravenous contrast.
CONTRAST:  100mL OMNIPAQUE IOHEXOL 300 MG/ML  SOLN

[Series 201: routine, idose (2) · axial · 0.78mm/px · z∈[+27,+477]mm · 8 of 115 slices shown, 9 images]
[im 13/115  soft-tissue]
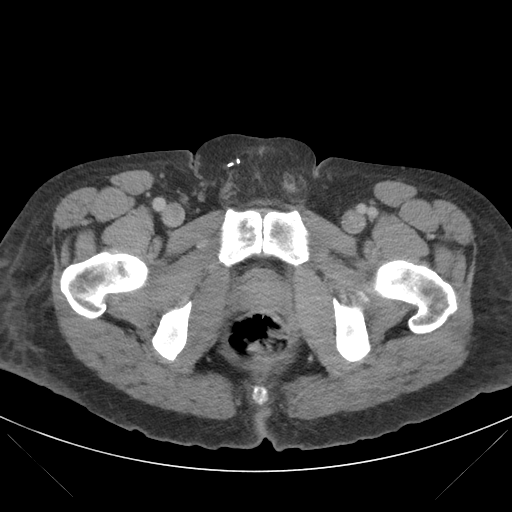
[im 13/115  bone]
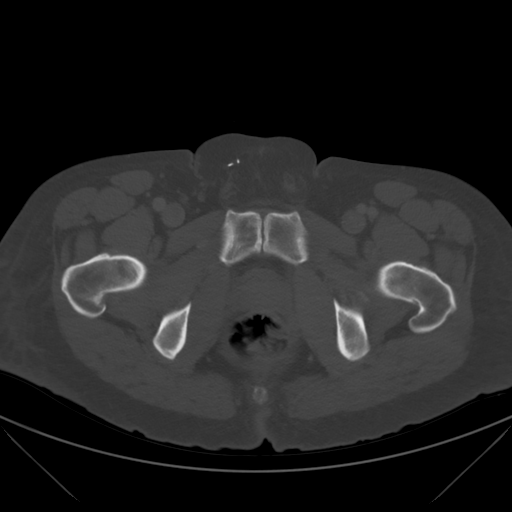
[im 25/115  soft-tissue]
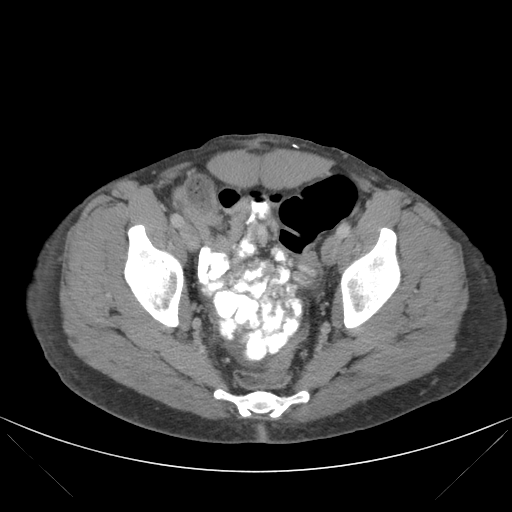
[im 37/115  soft-tissue]
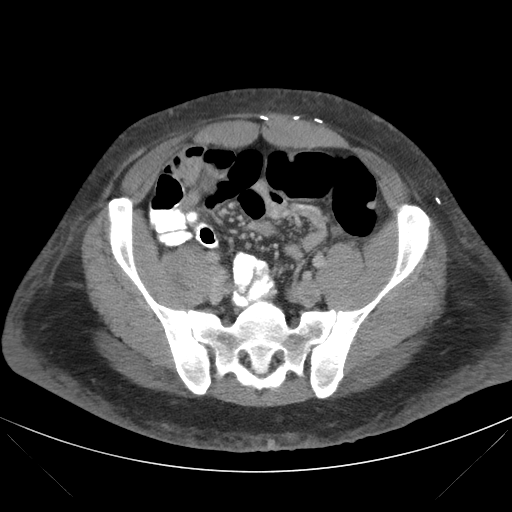
[im 49/115  soft-tissue]
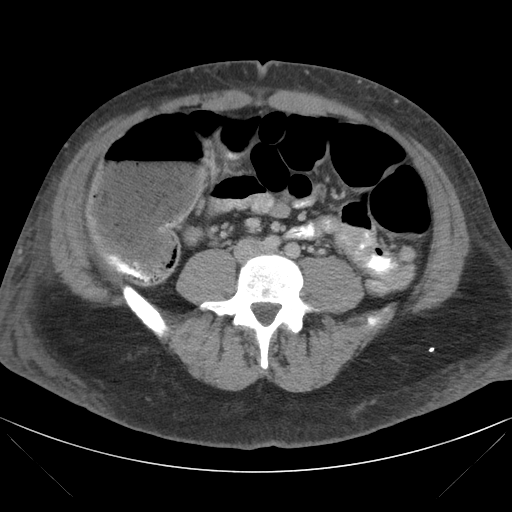
[im 67/115  soft-tissue]
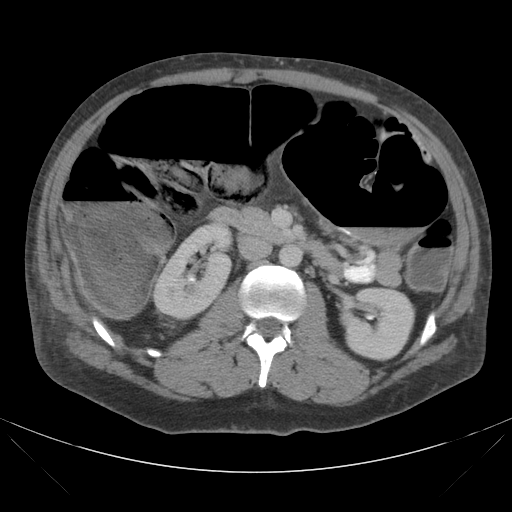
[im 79/115  soft-tissue]
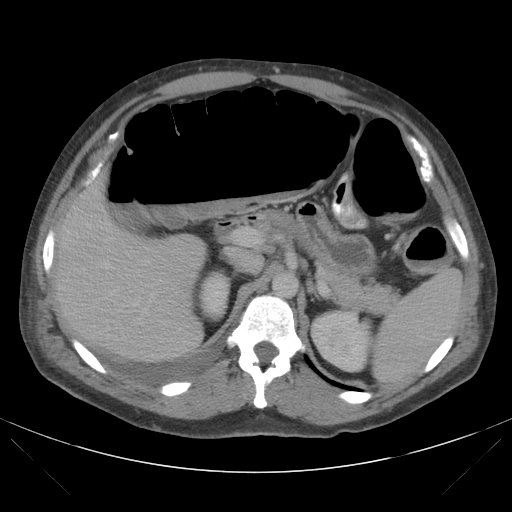
[im 91/115  soft-tissue]
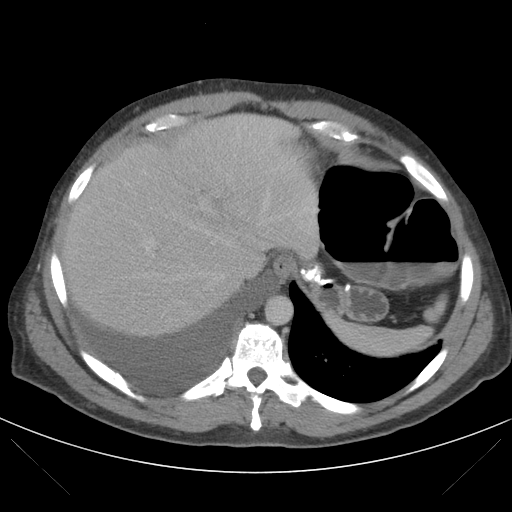
[im 103/115  soft-tissue]
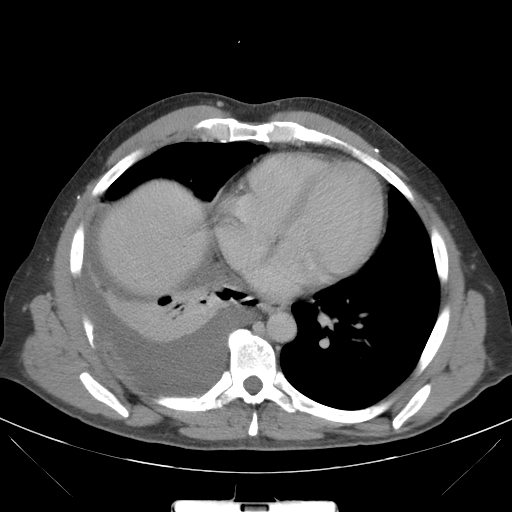

[Series 203: coronals, idose (2) · coronal · 0.50mm/px · 3 of 128 slices shown]
[im 43/128  soft-tissue]
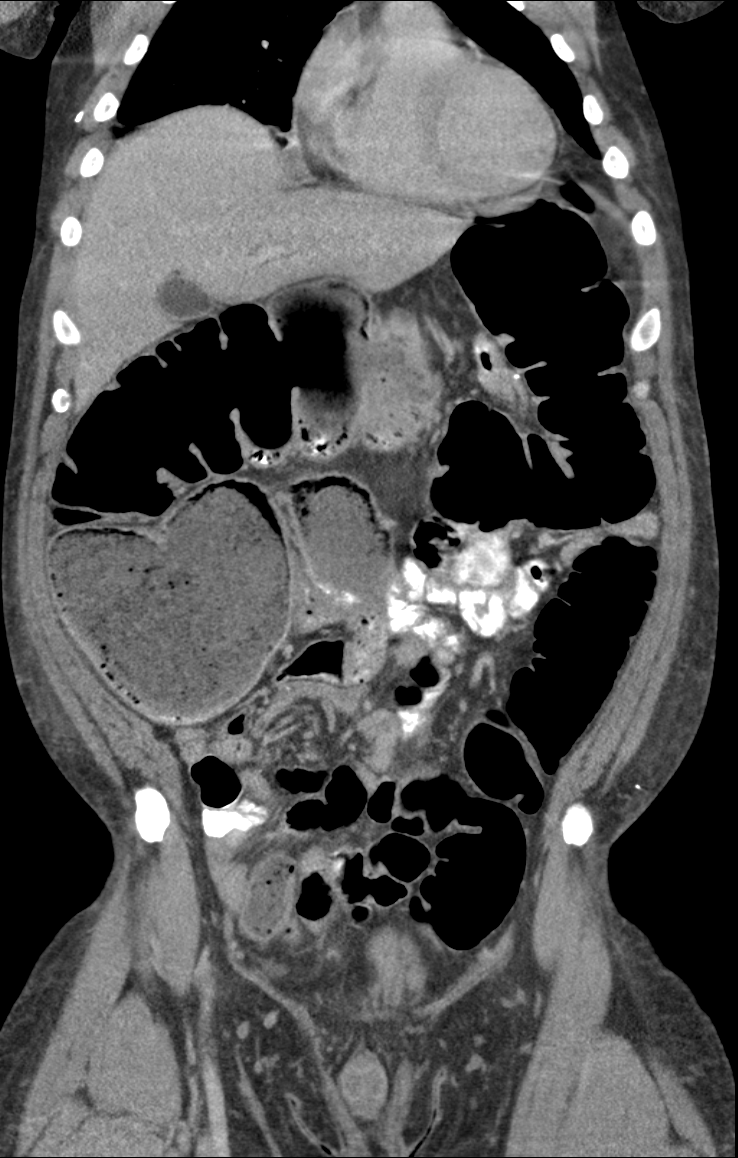
[im 57/128  soft-tissue]
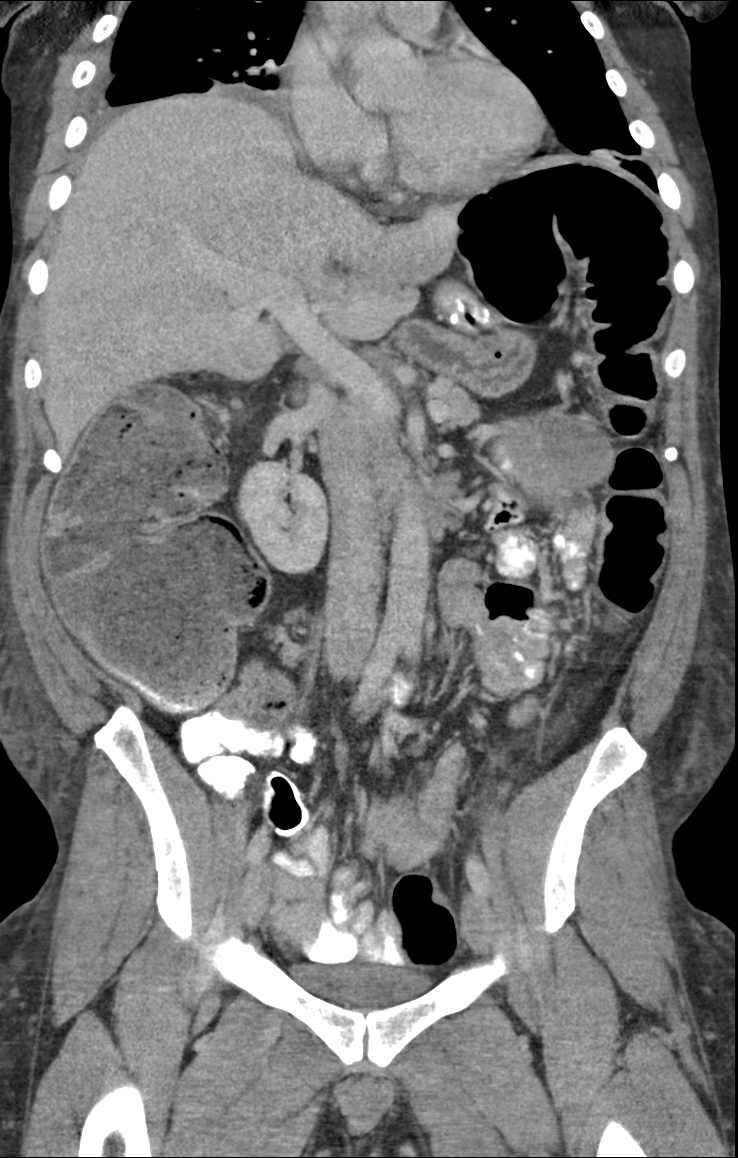
[im 71/128  soft-tissue]
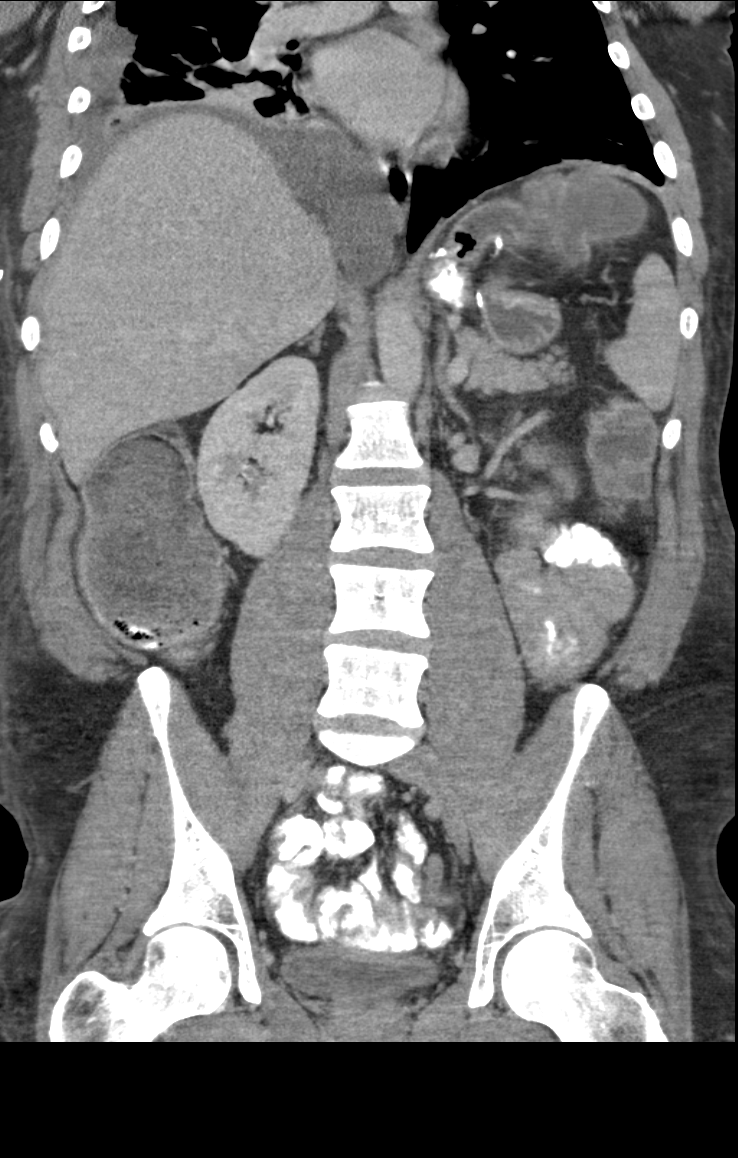

[11 of 46 positions shown; findings below may reference images not displayed]

FINDINGS: There is distension of the right colon and right-sided transverse
colon. There is fluid and air are identified within the colon. There
is no evidence of colonic torsion. The small bowel loops are normal
without evidence of small bowel obstruction. The appendix is not
seen.

The liver, spleen, pancreas, gallbladder, adrenal glands and kidneys
are normal. There is minimal right perinephric stranding which is
probably related to the patient's trauma last week. The aorta is
normal caliber. There is no abdominal lymphadenopathy.

Partial fluid-filled bladder is normal. Minimal free fluid is
identified in the pelvis.

Images of the lung bases demonstrate a moderate right pleural
effusion with compression atelectasis of the posterior right lung
base. The previously noted right rib fractures are again identified.
Degenerative joint changes of the spine are noted.
IMPRESSION: Marked distended right colon and right-sided transverse colon. There
is no evidence of colonic torsion. The findings may be due to a
dynamic ileus from pain medication treatment.

Moderate right pleural effusion with multiple right rib fractures.

## 2015-09-21 ENCOUNTER — Emergency Department (HOSPITAL_COMMUNITY)
Admission: EM | Admit: 2015-09-21 | Discharge: 2015-09-21 | Disposition: A | Discharge status: Home / Self Care | Payer: BLUE CROSS/BLUE SHIELD | Attending: Emergency Medicine | Admitting: Emergency Medicine

## 2015-09-21 ENCOUNTER — Encounter (HOSPITAL_COMMUNITY): Payer: Self-pay | Admitting: *Deleted

## 2015-09-21 DIAGNOSIS — Y9389 Activity, other specified: Secondary | ICD-10-CM | POA: Diagnosis not present

## 2015-09-21 DIAGNOSIS — S0181XA Laceration without foreign body of other part of head, initial encounter: Secondary | ICD-10-CM | POA: Diagnosis present

## 2015-09-21 DIAGNOSIS — Y998 Other external cause status: Secondary | ICD-10-CM | POA: Diagnosis not present

## 2015-09-21 DIAGNOSIS — Y92002 Bathroom of unspecified non-institutional (private) residence single-family (private) house as the place of occurrence of the external cause: Secondary | ICD-10-CM | POA: Diagnosis not present

## 2015-09-21 DIAGNOSIS — W01198A Fall on same level from slipping, tripping and stumbling with subsequent striking against other object, initial encounter: Secondary | ICD-10-CM | POA: Insufficient documentation

## 2015-09-21 DIAGNOSIS — IMO0002 Reserved for concepts with insufficient information to code with codable children: Secondary | ICD-10-CM

## 2015-09-21 DIAGNOSIS — Z23 Encounter for immunization: Secondary | ICD-10-CM | POA: Diagnosis not present

## 2015-09-21 MED ORDER — TETANUS-DIPHTH-ACELL PERTUSSIS 5-2.5-18.5 LF-MCG/0.5 IM SUSP
0.5000 mL | Freq: Once | INTRAMUSCULAR | Status: AC
  Administered 2015-09-21: 0.5 mL via INTRAMUSCULAR
  Filled 2015-09-21: qty 0.5

## 2015-09-21 MED ORDER — LIDOCAINE HCL (PF) 1 % IJ SOLN
5.0000 mL | Freq: Once | INTRAMUSCULAR | Status: AC
  Administered 2015-09-21: 5 mL
  Filled 2015-09-21: qty 5

## 2015-09-21 NOTE — ED Provider Notes (Signed)
CSN: [id number]     Arrival date & time 09/21/15  0809 History   First MD Initiated Contact with Patient 09/21/15 [phone]     Chief Complaint  Patient presents with  . Head Laceration    HPI   Mr. Everett is a 39 yo M PMH chiari malformation pw right forehead laceration since 06:15 this morning. He states he normally has a bathmat in his shower, but it was taken out prior to his shower this morning. The shower was slippery, causing him to fall and hit the right side of his head on soap holder. He ran his wound under water, applied neosporin, and then a bandaid. No HA, AMS, LOC, dizziness, vision changes, N/V.   Patient denies anticoagulation use. Last tetanus unknown.   Past Medical History  Diagnosis Date  . Chiari malformation    Past Surgical History  Procedure Laterality Date  . Abdominal surgery    . Cosmetic surgery      body lift  . Gastric bypass  2007   History reviewed. No pertinent family history. Social History  Substance Use Topics  . Smoking status: Never Smoker   . Smokeless tobacco: None  . Alcohol Use: Yes    Review of Systems  Constitutional: Negative.   HENT: Negative.   Eyes: Negative.   Respiratory: Negative.   Cardiovascular: Negative.   Gastrointestinal: Negative.   Endocrine: Negative.   Genitourinary: Negative.   Musculoskeletal: Negative.   Skin: Positive for wound.       Right forehead  Allergic/Immunologic: Negative.   Neurological: Negative.   Hematological: Negative.   Psychiatric/Behavioral: Negative.       Allergies  Ibuprofen and Nsaids  Home Medications   Prior to Admission medications   Medication Sig Start Date End Date Taking? Authorizing Provider  methocarbamol (ROBAXIN) 500 MG tablet 1500mg  po qid x4d then 1000mg  po q6h prn muscle spasm 08/11/14   Lisette Abu, PA-C  oxyCODONE-acetaminophen (PERCOCET) 10-325 MG per tablet Take 1-2 tablets by mouth every 4 (four) hours as needed for pain. 08/19/14   Lisette Abu,  PA-C  traMADol (ULTRAM) 50 MG tablet Take 2 tablets (100 mg total) by mouth every 6 (six) hours as needed. 09/01/14   Emina Riebock, NP   BP 120/69 mmHg  Pulse 65  Temp(Src) 98.5 F (36.9 C) (Oral)  Resp 17  Ht 6' 0.5" (1.842 m)  Wt 235 lb (106.595 kg)  BMI 31.42 kg/m2  SpO2 97% Physical Exam  Constitutional: He is oriented to person, place, and time. He appears well-developed and well-nourished. No distress.  HENT:  Head: Normocephalic and atraumatic.  Mouth/Throat: Oropharynx is clear and moist. No oropharyngeal exudate.  Eyes: Conjunctivae are normal. Pupils are equal, round, and reactive to light. Right eye exhibits no discharge. Left eye exhibits no discharge. No scleral icterus.  Neck: Normal range of motion. No tracheal deviation present.  Cardiovascular: Normal rate, regular rhythm, normal heart sounds and intact distal pulses.  Exam reveals no gallop and no friction rub.   No murmur heard. Pulmonary/Chest: Effort normal and breath sounds normal. No respiratory distress. He has no wheezes. He has no rales. He exhibits no tenderness.  Abdominal: Soft. Bowel sounds are normal. He exhibits no distension and no mass. There is no tenderness. There is no rebound and no guarding.  Musculoskeletal: Normal range of motion. He exhibits no edema.  Lymphadenopathy:    He has no cervical adenopathy.  Neurological: He is alert and oriented to person, place, and  time. No cranial nerve deficit. Coordination normal.  Skin: Skin is warm and dry. No rash noted. He is not diaphoretic. No erythema.  1.5 cm crescent-shaped laceration with smooth margins. Bleeding controlled upon examination.   Psychiatric: He has a normal mood and affect. His behavior is normal.  Nursing note and vitals reviewed.   ED Course  .Marland KitchenLaceration Repair Date/Time: 09/21/2015 8:40 AM Performed by: Alisia Ferrari NICOLE Authorized by: Udell Lions Consent: Verbal consent obtained. Risks and benefits: risks,  benefits and alternatives were discussed Consent given by: patient Patient understanding: patient states understanding of the procedure being performed Patient consent: the patient's understanding of the procedure matches consent given Procedure consent: procedure consent matches procedure scheduled Patient identity confirmed: verbally with patient and arm band Body area: head/neck Location details: forehead Laceration length: 1.5 cm Foreign bodies: no foreign bodies Tendon involvement: none Nerve involvement: none Vascular damage: no Anesthesia: local infiltration Local anesthetic: lidocaine 1% without epinephrine Anesthetic total: 1.5 ml Patient sedated: no Irrigation solution: saline Irrigation method: 60 ml syringe with shield guard  Amount of cleaning: standard Skin closure: 6-0 Prolene Number of sutures: 2 Technique: simple Approximation: close Approximation difficulty: simple Dressing: antibiotic ointment and 4x4 sterile gauze Patient tolerance: Patient tolerated the procedure well with no immediate complications      MDM   Final diagnoses:  Laceration   Patient afebrile without neurologic changes. He denies pain. Will irrigate with saline and suture.   Patient understands plan and in agreement with sutures. He tolerated procedure well.   Applied bacitracin ointment and wound dressing. Advised patient to follow-up to have sutures removed in 5 days. Discussed reasons to return. Patient can be safely discharged home.     Sterling Lions, PA-C 09/22/15 [place], MD 09/23/15 [phone]

## 2015-09-21 NOTE — Discharge Instructions (Signed)
Mr. Hershell, Brandl meeting you. Please follow-up with any healthcare provider within 5 days that can remove your sutures. Please return to the Emergency Department if you develop any fevers, headaches, dizziness, loss of consciousness, confusion, drainage/redness/increasing pain from the wound.   Farrel Gordon, PA-C

## 2015-09-21 NOTE — ED Notes (Signed)
PT reports he fell in shower this AM  And hit head on soap holder. Pt has a lac to Rt side of fore head. Pt denies any pain ,vision changes or LOC at time of accident.

## 2018-09-20 DIAGNOSIS — Z125 Encounter for screening for malignant neoplasm of prostate: Secondary | ICD-10-CM | POA: Diagnosis not present

## 2018-09-20 DIAGNOSIS — Z0000 Encounter for general adult medical examination without abnormal findings: Secondary | ICD-10-CM | POA: Diagnosis not present

## 2018-09-20 DIAGNOSIS — E559 Vitamin D deficiency, unspecified: Secondary | ICD-10-CM | POA: Diagnosis not present

## 2018-09-20 DIAGNOSIS — E038 Other specified hypothyroidism: Secondary | ICD-10-CM | POA: Diagnosis not present

## 2018-09-20 DIAGNOSIS — R7301 Impaired fasting glucose: Secondary | ICD-10-CM | POA: Diagnosis not present

## 2018-10-03 DIAGNOSIS — Z1212 Encounter for screening for malignant neoplasm of rectum: Secondary | ICD-10-CM | POA: Diagnosis not present

## 2019-01-11 DIAGNOSIS — R21 Rash and other nonspecific skin eruption: Secondary | ICD-10-CM | POA: Diagnosis not present

## 2021-04-20 ENCOUNTER — Other Ambulatory Visit: Payer: Self-pay

## 2021-04-20 ENCOUNTER — Ambulatory Visit (INDEPENDENT_AMBULATORY_CARE_PROVIDER_SITE_OTHER): Payer: 59 | Admitting: Family Medicine

## 2021-04-20 ENCOUNTER — Encounter: Payer: Self-pay | Admitting: Family Medicine

## 2021-04-20 VITALS — BP 128/84 | HR 65 | Ht 72.0 in | Wt 246.0 lb

## 2021-04-20 DIAGNOSIS — E538 Deficiency of other specified B group vitamins: Secondary | ICD-10-CM | POA: Diagnosis not present

## 2021-04-20 DIAGNOSIS — Z9884 Bariatric surgery status: Secondary | ICD-10-CM | POA: Diagnosis not present

## 2021-04-20 DIAGNOSIS — Z0000 Encounter for general adult medical examination without abnormal findings: Secondary | ICD-10-CM | POA: Diagnosis not present

## 2021-04-20 DIAGNOSIS — E559 Vitamin D deficiency, unspecified: Secondary | ICD-10-CM

## 2021-04-20 DIAGNOSIS — Z1211 Encounter for screening for malignant neoplasm of colon: Secondary | ICD-10-CM | POA: Insufficient documentation

## 2021-04-20 DIAGNOSIS — Z13220 Encounter for screening for lipoid disorders: Secondary | ICD-10-CM

## 2021-04-20 LAB — CBC WITH DIFFERENTIAL/PLATELET
Lymphs Abs: 2310 cells/uL (ref 850–3900)
MCHC: 33.3 g/dL (ref 32.0–36.0)

## 2021-04-20 NOTE — Patient Instructions (Signed)

## 2021-04-20 NOTE — Assessment & Plan Note (Signed)
Well adult Orders Placed This Encounter  Procedures  . COMPLETE METABOLIC PANEL WITH GFR  . CBC with Differential  . Lipid Panel w/reflex Direct LDL  . TSH  . Vitamin D (25 hydroxy)  . B12  Screening: lipid panel. Checking vitamin d and B12 levels due to history of gastric bypass.  Immunizations: UTD Anticipatory guidance/Risk factor reduction:  Recommendations per AVS

## 2021-04-20 NOTE — Progress Notes (Signed)
Tanner Carroll - 45 y.o. male MRN [id number]  Date of birth: 12/27/1975  Subjective No chief complaint on file.   HPI Tanner Carroll is a 45 y.o. male here today for initial visit and annual exam.  Reports that he previously weighed nearly 600lbs prior to having Roux-en-y gastric bypass.  He has done well with this with good weight loss.  He did have plastic surgery as well for excess skin removal.    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  He doesn't have really have headaches.    ROS:  A comprehensive ROS was completed and negative except as noted per HPI  Allergies  Allergen Reactions  . Ibuprofen Other (See Comments)    Gastric bypass surgery, cannot have  . Nsaids Other (See Comments)    Gastric bypass surgery, cannot have    Past Medical History:  Diagnosis Date  . Chiari malformation     Past Surgical History:  Procedure Laterality Date  . ABDOMINAL SURGERY    . APPENDECTOMY    . COSMETIC SURGERY     body lift  . GASTRIC BYPASS  2007    Social History   Socioeconomic History  . Marital status: Single    Spouse name: Not on file  . Number of children: Not on file  . Years of education: Not on file  . Highest education level: Not on file  Occupational History  . Not on file  Tobacco Use  . Smoking status: Never Smoker  . Smokeless tobacco: Never Used  Substance and Sexual Activity  . Alcohol use: Yes  . Drug use: No  . Sexual activity: Yes    Birth control/protection: Condom  Other Topics Concern  . Not on file  Social History Narrative  . Not on file   Social Determinants of Health   Financial Resource Strain: Not on file  Food Insecurity: Not on file  Transportation Needs: Not on file  Physical Activity: Not on file  Stress: Not on file  Social Connections: Not on file    Family History  Problem Relation Age of Onset  . Cancer Mother        breast and melanoma    Health Maintenance  Topic  Date Due  . HIV Screening  Never done  . Hepatitis C Screening  Never done  . INFLUENZA VACCINE  06/21/2021  . TETANUS/TDAP  09/20/2025  . Zoster Vaccines- Shingrix (1 of 2) 01/13/2027  . COVID-19 Vaccine  Completed  . HPV VACCINES  Aged Out     ----------------------------------------------------------------------------------------------------------------------------------------------------------------------------------------------------------------- Physical Exam BP 128/84   Pulse 65   Ht 6' (1.829 m)   Wt 246 lb (111.6 kg)   BMI 33.36 kg/m   Physical Exam Constitutional:      General: He is not in acute distress.    Appearance: Normal appearance.  HENT:     Head: Normocephalic and atraumatic.     Right Ear: External ear normal.     Left Ear: External ear normal.  Eyes:     General: No scleral icterus. Neck:     Thyroid: No thyromegaly.  Cardiovascular:     Rate and Rhythm: Normal rate and regular rhythm.     Heart sounds: Normal heart sounds.  Pulmonary:     Effort: Pulmonary effort is normal.     Breath sounds: Normal breath sounds.  Abdominal:     General: Bowel sounds are normal. There is no distension.  Palpations: Abdomen is soft.     Tenderness: There is no abdominal tenderness. There is no guarding.  Musculoskeletal:     Cervical back: Normal range of motion and neck supple.  Lymphadenopathy:     Cervical: No cervical adenopathy.  Skin:    General: Skin is warm and dry.     Findings: No rash.  Neurological:     General: No focal deficit present.     Mental Status: He is alert and oriented to person, place, and time.     Cranial Nerves: No cranial nerve deficit.     Motor: No abnormal muscle tone.  Psychiatric:        Mood and Affect: Mood normal.        Behavior: Behavior normal.      ------------------------------------------------------------------------------------------------------------------------------------------------------------------------------------------------------------------- Assessment and Plan  Well adult exam Well adult Orders Placed This Encounter  Procedures  . COMPLETE METABOLIC PANEL WITH GFR  . CBC with Differential  . Lipid Panel w/reflex Direct LDL  . TSH  . Vitamin D (25 hydroxy)  . B12  Screening: lipid panel. Checking vitamin d and B12 levels due to history of gastric bypass.  Immunizations: UTD Anticipatory guidance/Risk factor reduction:  Recommendations per AVS   No orders of the defined types were placed in this encounter.   No follow-ups on file.    This visit occurred during the SARS-CoV-2 public health emergency.  Safety protocols were in place, including screening questions prior to the visit, additional usage of staff PPE, and extensive cleaning of exam room while observing appropriate contact time as indicated for disinfecting solutions.

## 2021-04-21 LAB — CBC WITH DIFFERENTIAL/PLATELET
Absolute Monocytes: 389 cells/uL (ref 200–950)
Basophils Absolute: 73 cells/uL (ref 0–200)
Basophils Relative: 1.1 %
Eosinophils Absolute: 99 cells/uL (ref 15–500)
Eosinophils Relative: 1.5 %
HCT: 48.4 % (ref 38.5–50.0)
Hemoglobin: 16.1 g/dL (ref 13.2–17.1)
MCH: 29.6 pg (ref 27.0–33.0)
MCV: 89 fL (ref 80.0–100.0)
MPV: 11.4 fL (ref 7.5–12.5)
Monocytes Relative: 5.9 %
Neutro Abs: 3729 cells/uL (ref 1500–7800)
Neutrophils Relative %: 56.5 %
Platelets: 262 10*3/uL (ref 140–400)
RBC: 5.44 10*6/uL (ref 4.20–5.80)
RDW: 12.4 % (ref 11.0–15.0)
Total Lymphocyte: 35 %
WBC: 6.6 10*3/uL (ref 3.8–10.8)

## 2021-04-21 LAB — COMPLETE METABOLIC PANEL WITH GFR
AG Ratio: 2.1 (calc) (ref 1.0–2.5)
ALT: 17 U/L (ref 9–46)
AST: 17 U/L (ref 10–40)
Albumin: 4.7 g/dL (ref 3.6–5.1)
Alkaline phosphatase (APISO): 59 U/L (ref 36–130)
BUN: 11 mg/dL (ref 7–25)
CO2: 27 mmol/L (ref 20–32)
Calcium: 9.6 mg/dL (ref 8.6–10.3)
Chloride: 103 mmol/L (ref 98–110)
Creat: 0.88 mg/dL (ref 0.60–1.35)
GFR, Est African American: 121 mL/min/{1.73_m2} (ref >=60)
GFR, Est Non African American: 104 mL/min/{1.73_m2} (ref >=60)
Globulin: 2.2 g/dL (calc) (ref 1.9–3.7)
Glucose, Bld: 99 mg/dL (ref 65–99)
Potassium: 4.4 mmol/L (ref 3.5–5.3)
Sodium: 139 mmol/L (ref 135–146)
Total Bilirubin: 0.8 mg/dL (ref 0.2–1.2)
Total Protein: 6.9 g/dL (ref 6.1–8.1)

## 2021-04-21 LAB — LIPID PANEL W/REFLEX DIRECT LDL
Cholesterol: 187 mg/dL (ref <200)
HDL: 68 mg/dL (ref >=40)
LDL Cholesterol (Calc): 102 mg/dL (calc) — ABNORMAL HIGH
Non-HDL Cholesterol (Calc): 119 mg/dL (calc) (ref <130)
Total CHOL/HDL Ratio: 2.8 (calc) (ref <5.0)
Triglycerides: 77 mg/dL (ref <150)

## 2021-04-21 LAB — VITAMIN D 25 HYDROXY (VIT D DEFICIENCY, FRACTURES): Vit D, 25-Hydroxy: 125 ng/mL — ABNORMAL HIGH (ref 30–100)

## 2021-04-21 LAB — TSH: TSH: 2.26 mIU/L (ref 0.40–4.50)

## 2021-04-21 LAB — VITAMIN B12: Vitamin B-12: 953 pg/mL (ref 200–1100)

## 2021-07-06 ENCOUNTER — Telehealth: Payer: 59

## 2021-07-08 ENCOUNTER — Telehealth (INDEPENDENT_AMBULATORY_CARE_PROVIDER_SITE_OTHER): Payer: 59 | Admitting: Medical-Surgical

## 2021-07-08 ENCOUNTER — Other Ambulatory Visit: Payer: Self-pay

## 2021-07-08 ENCOUNTER — Encounter: Payer: Self-pay | Admitting: Family Medicine

## 2021-07-08 ENCOUNTER — Encounter: Payer: Self-pay | Admitting: Medical-Surgical

## 2021-07-08 DIAGNOSIS — Z636 Dependent relative needing care at home: Secondary | ICD-10-CM | POA: Diagnosis not present

## 2021-07-08 DIAGNOSIS — F419 Anxiety disorder, unspecified: Secondary | ICD-10-CM | POA: Diagnosis not present

## 2021-07-08 DIAGNOSIS — F32A Depression, unspecified: Secondary | ICD-10-CM

## 2021-07-08 MED ORDER — BUSPIRONE HCL 5 MG PO TABS: 5.0000 mg | ORAL_TABLET | Freq: Two times a day (BID) | ORAL | 1 refills | Status: DC | PRN

## 2021-07-08 NOTE — Progress Notes (Signed)
Pt's mother is currently in [facility] due to brain bleed. Has lost some motor functions but is improving and now using a walker. Pt currently going through major life changes due to mother's illness. About to become Mom's primary caregiver when she is discharged. Single, only child, doesn't have large support system.  PHQ 4 GAD 5

## 2021-07-08 NOTE — Progress Notes (Signed)
Opened in error. T. Nguyen Todorov, CMA 

## 2021-07-08 NOTE — Progress Notes (Signed)
Virtual Visit via Video Note  I connected with Tanner Carroll on 07/08/21 at  9:10 AM EDT by a video enabled telemedicine application and verified that I am speaking with the correct person using two identifiers.   I discussed the limitations of evaluation and management by telemedicine and the availability of in person appointments. The patient expressed understanding and agreed to proceed.  Patient location: home Provider locations: office  Subjective:    CC: caregiver stress  HPI: Pleasant 45 year old male presenting via [place] video visit to discuss recent mood concerns related to caregiver stress.  He does have a history of mood issues and has been treated previously with medication such as Lexapro, Zoloft, Wellbutrin, and Strattera.  He reports this did not help in the past and did not see much benefit while taking them.  He has also tried counseling in the past but feels that he did not give it a good college try at that time.  Unfortunately, recently his ability to cope with regular life stresses has been challenged.  He was doing very well off of medications and had developed good coping mechanisms.  Sadly, his dad passed away in February 08, 2020 and his mom has been dealing with significant health issues.  She is currently in rehab and will be released next week.  He is her primary caregiver and she will require 24/7 care for an undetermined amount of time.  Right now with the stress of his every day responsibilities along with his mother's illness and her requirement for round-the-clock care, he feels like he needs something to help support him and is interested in starting a medication.  He is very hesitant to go back on antidepressants because it took a very long time for him to successfully wean off.  He is interested in a medication that can help in the moment and would like something that will help take the edge off when he is significantly stressed out.  Not currently interested in counseling  simply because of the limited time and he feels like his plate is quite full.  Denies SI/HI.  Past medical history, Surgical history, Family history not pertinant except as noted below, Social history, Allergies, and medications have been entered into the medical record, reviewed, and corrections made.   Review of Systems: See HPI for pertinent positives and negatives.   Objective:    General: Speaking clearly in complete sentences without any shortness of breath.  Alert and oriented x3.  Normal judgment. No apparent acute distress.  Impression and Recommendations:    1. Caregiver stress Discussed various options for treatment.  In the setting of caregiver stress exacerbating known anxiety/depression issues, would recommend a daily maintenance medication.  He has not tried duloxetine in the past and I think this would be a good option for him.  He is very hesitant to start something like this today which is understandable given his history.  Since he would like to have something on hand to take as an as-needed basis, we will start with BuSpar 5-15 mg twice daily as needed.  Advised the patient that this medication does work best when taken regularly but can be taken as needed.  Recommend considering a maintenance medication since BuSpar and fluoxetine or other SSRIs do work well together.  No counseling at this point but may be something to recommend in the future if time permits.  I discussed the assessment and treatment plan with the patient. The patient was provided an opportunity to ask questions  and all were answered. The patient agreed with the plan and demonstrated an understanding of the instructions.   The patient was advised to call back or seek an in-person evaluation if the symptoms worsen or if the condition fails to improve as anticipated.  25 minutes of non-face-to-face time was provided during this encounter.  Return in about 4 weeks (around 08/05/2021) for mood follow up with  PCP.  Clearnce Sorrel, DNP, APRN, FNP-BC [facility]

## 2021-08-11 ENCOUNTER — Ambulatory Visit (INDEPENDENT_AMBULATORY_CARE_PROVIDER_SITE_OTHER): Payer: 59 | Admitting: Family Medicine

## 2021-08-11 ENCOUNTER — Encounter: Payer: Self-pay | Admitting: Family Medicine

## 2021-08-11 ENCOUNTER — Other Ambulatory Visit: Payer: Self-pay

## 2021-08-11 VITALS — BP 133/70 | HR 61 | Wt 256.0 lb

## 2021-08-11 DIAGNOSIS — Z23 Encounter for immunization: Secondary | ICD-10-CM

## 2021-08-11 DIAGNOSIS — F411 Generalized anxiety disorder: Secondary | ICD-10-CM

## 2021-08-11 MED ORDER — ESCITALOPRAM OXALATE 10 MG PO TABS: ORAL_TABLET | ORAL | 3 refills | Status: DC

## 2021-08-11 MED ORDER — ALPRAZOLAM 0.5 MG PO TABS: 0.5000 mg | ORAL_TABLET | Freq: Two times a day (BID) | ORAL | 1 refills | Status: DC | PRN

## 2021-08-11 NOTE — Assessment & Plan Note (Signed)
Staring lexapro daily.  Start alprazolam up to twice per day prn.  He understands that this is a short term medication.  Visiting a therapist is recommended.  Return in about 4 weeks (around 09/08/2021) for mood.

## 2021-08-11 NOTE — Patient Instructions (Signed)
Managing Stress, Adult Feeling a certain amount of stress is normal. Stress helps our body and mind get ready to deal with the demands of life. Stress hormones can motivate you to do well at work and meet your responsibilities. However severe or long-lasting (chronic) stress can affect your mental and physical health. Chronic stress puts you at higher risk for anxiety, depression, and other health problems like digestive problems, muscle aches, heart disease, high blood pressure, and stroke. What are the causes? Common causes of stress include: Demands from work, such as deadlines, feeling overworked, or having long hours. Pressures at home, such as money issues, disagreements with a spouse, or parenting issues. Pressures from major life changes, such as divorce, moving, loss of a loved one, or chronic illness. You may be at higher risk for stress-related problems if you do not get enough sleep, are in poor health, do not have emotional support, or have a mental health disorder like anxiety or depression. How to recognize stress Stress can make you: Have trouble sleeping. Feel sad, anxious, irritable, or overwhelmed. Lose your appetite. Overeat or want to eat unhealthy foods. Want to use drugs or alcohol. Stress can also cause physical symptoms, such as: Sore, tense muscles, especially in the shoulders and neck. Headaches. Trouble breathing. A faster heart rate. Stomach pain, nausea, or vomiting. Diarrhea or constipation. Trouble concentrating. Follow these instructions at home: Lifestyle Identify the source of your stress and your reaction to it. See a therapist who can help you change your reactions. When there are stressful events: Talk about it with family, friends, or co-workers. Try to think realistically about stressful events and not ignore them or overreact. Try to find the positives in a stressful situation and not focus on the negatives. Cut back on responsibilities at work  and home, if possible. Ask for help from friends or family members if you need it. Find ways to cope with stress, such as: Meditation. Deep breathing. Yoga or tai chi. Progressive muscle relaxation. Doing art, playing music, or reading. Making time for fun activities. Spending time with family and friends. Get support from family, friends, or spiritual resources. Eating and drinking Eat a healthy diet. This includes: Eating foods that are high in fiber, such as beans, whole grains, and fresh fruits and vegetables. Limiting foods that are high in fat and processed sugars, such as fried and sweet foods. Do not skip meals or overeat. Drink enough fluid to keep your urine pale yellow. Alcohol use Do not drink alcohol if: Your health care provider tells you not to drink. You are pregnant, may be pregnant, or are planning to become pregnant. Drinking alcohol is a way some people try to ease their stress. This can be dangerous, so if you drink alcohol: Limit how much you use to: 0-1 drink a day for women. 0-2 drinks a day for men. Be aware of how much alcohol is in your drink. In the [place] one drink equals one 12 oz bottle of beer (355 mL), one 5 oz glass of wine (148 mL), or one 1 oz glass of hard liquor (44 mL). Activity  Include 30 minutes of exercise in your daily schedule. Exercise is a good stress reducer. Include time in your day for an activity that you find relaxing. Try taking a walk, going on a bike ride, reading a book, or listening to music. Schedule your time in a way that lowers stress, and keep a consistent schedule. Prioritize what is most important to get done.  General instructions Get enough sleep. Try to go to sleep and get up at about the same time every day. Take over-the-counter and prescription medicines only as told by your health care provider. Do not use any products that contain nicotine or tobacco, such as cigarettes, e-cigarettes, and chewing tobacco. If you  need help quitting, ask your health care provider. Do not use drugs or smoke to cope with stress. Keep all follow-up visits as told by your health care provider. This is important. Where to find support Talk with your health care provider about stress management or finding a support group. Find a therapist to work with you on your stress management techniques. Contact a health care provider if: Your stress symptoms get worse. You are unable to manage your stress at home. You are struggling to stop using drugs or alcohol. Get help right away if: You may be a danger to yourself or others. You have any thoughts of death or suicide. If you ever feel like you may hurt yourself or others, or have thoughts about taking your own life, get help right away. You can go to your nearest emergency department or call: Your local emergency services (911 in the [place] A suicide crisis helpline, such as the [facility] at (912)367-1532. This is open 24 hours a day. Summary Feeling a certain amount of stress is normal, but severe or long-lasting (chronic) stress can affect your mental and physical health. Chronic stress can put you at higher risk for anxiety, depression, and other health problems like digestive problems, muscle aches, heart disease, high blood pressure, and stroke. You may be at higher risk for stress-related problems if you do not get enough sleep, are in poor health, lack emotional support, or have a mental health disorder like anxiety or depression. Identify the source of your stress and your reaction to it. Try talking about stressful events with family, friends, or co-workers, finding a coping method, or getting support from spiritual resources. If you need more help, talk with your health care provider about finding a support group or a mental health therapist. This information is not intended to replace advice given to you by your health care provider. Make sure  you discuss any questions you have with your health care provider. Document Revised: 01/15/2021 Document Reviewed: 06/05/2019 Elsevier Patient Education  [employer].

## 2021-08-11 NOTE — Progress Notes (Signed)
Tanner Carroll - 45 y.o. male MRN [id number]  Date of birth: [birth year]  Subjective Chief Complaint  Patient presents with   Stress    HPI Tanner Carroll is a 45 y.o. male here to discuss increased stress and anxiety.  He reports increased stress related to being sole caregiver for his mother who is ill and has limited mobility.  He feels very overwhelmed by the amount of responsibility he has with work and home life.  He was seen by Caryl Asp a few weeks ago and started on Buspar.  He doesn't feel that this has been very helpful.  He has been on several SSRI's in the past with varying degrees of response.  He would be willing to try some of these again.  He does describe episodes of severe anxiety and panic at times.    ROS:  A comprehensive ROS was completed and negative except as noted per HPI  Allergies  Allergen Reactions   Ibuprofen Other (See Comments)    Gastric bypass surgery, cannot have   Nsaids Other (See Comments)    Gastric bypass surgery, cannot have    Past Medical History:  Diagnosis Date   Chiari malformation     Past Surgical History:  Procedure Laterality Date   ABDOMINAL SURGERY     APPENDECTOMY     COSMETIC SURGERY     body lift   GASTRIC BYPASS  2007    Social History   Socioeconomic History   Marital status: Single    Spouse name: Not on file   Number of children: Not on file   Years of education: Not on file   Highest education level: Not on file  Occupational History   Not on file  Tobacco Use   Smoking status: Never   Smokeless tobacco: Never  Substance and Sexual Activity   Alcohol use: Yes   Drug use: No   Sexual activity: Yes    Birth control/protection: Condom  Other Topics Concern   Not on file  Social History Narrative   Not on file   Social Determinants of Health   Financial Resource Strain: Not on file  Food Insecurity: Not on file  Transportation Needs: Not on file  Physical Activity: Not on file  Stress: Not on file  Social  Connections: Not on file    Family History  Problem Relation Age of Onset   Cancer Mother        breast and melanoma    Health Maintenance  Topic Date Due   HIV Screening  Never done   Hepatitis C Screening  Never done   TETANUS/TDAP  09/20/2025   INFLUENZA VACCINE  Completed   COVID-19 Vaccine  Completed   HPV VACCINES  Aged Out     ----------------------------------------------------------------------------------------------------------------------------------------------------------------------------------------------------------------- Physical Exam BP 133/70   Pulse 61   Wt 256 lb (116.1 kg)   SpO2 100% Comment: on RA  BMI 34.72 kg/m   Physical Exam Constitutional:      Appearance: Normal appearance.  Eyes:     General: No scleral icterus. Cardiovascular:     Rate and Rhythm: Normal rate and regular rhythm.  Pulmonary:     Effort: Pulmonary effort is normal.     Breath sounds: Normal breath sounds.  Musculoskeletal:     Cervical back: Neck supple.  Neurological:     General: No focal deficit present.     Mental Status: He is alert.  Psychiatric:        Mood and Affect: Mood  normal.        Behavior: Behavior normal.    ------------------------------------------------------------------------------------------------------------------------------------------------------------------------------------------------------------------- Assessment and Plan  GAD (generalized anxiety disorder) Staring lexapro daily.  Start alprazolam up to twice per day prn.  He understands that this is a short term medication.  Visiting a therapist is recommended.  Return in about 4 weeks (around 09/08/2021) for mood.   Meds ordered this encounter  Medications   escitalopram (LEXAPRO) 10 MG tablet    Sig: Take 5 mg daily x1 week then increase to 10mg  daily    Dispense:  30 tablet    Refill:  3   ALPRAZolam (XANAX) 0.5 MG tablet    Sig: Take 1 tablet (0.5 mg total) by mouth 2  (two) times daily as needed for anxiety.    Dispense:  20 tablet    Refill:  1    Return in about 4 weeks (around 09/08/2021) for mood.    This visit occurred during the SARS-CoV-2 public health emergency.  Safety protocols were in place, including screening questions prior to the visit, additional usage of staff PPE, and extensive cleaning of exam room while observing appropriate contact time as indicated for disinfecting solutions.

## 2021-08-15 ENCOUNTER — Encounter: Payer: Self-pay | Admitting: Family Medicine

## 2021-09-08 ENCOUNTER — Other Ambulatory Visit: Payer: Self-pay

## 2021-09-08 ENCOUNTER — Ambulatory Visit (INDEPENDENT_AMBULATORY_CARE_PROVIDER_SITE_OTHER): Payer: 59 | Admitting: Family Medicine

## 2021-09-08 ENCOUNTER — Encounter: Payer: Self-pay | Admitting: Family Medicine

## 2021-09-08 DIAGNOSIS — F411 Generalized anxiety disorder: Secondary | ICD-10-CM | POA: Diagnosis not present

## 2021-09-08 MED ORDER — ESCITALOPRAM OXALATE 20 MG PO TABS: 20.0000 mg | ORAL_TABLET | Freq: Every day | ORAL | 3 refills | Status: DC

## 2021-09-08 NOTE — Progress Notes (Signed)
THEDORE Carroll - 45 y.o. male MRN [id number]  Date of birth: 07/25/1976  Subjective Chief Complaint  Patient presents with   Mood    HPI Tanner Carroll is a 45 year old male here today for follow-up of depression and anxiety.  He reports that he continues to have difficulty with depression and anxiety as well as stress related to caring for his mother as well as stress related to work.  He has discontinued BuSpar as he found that this was not effective.  He is tolerating Lexapro well at current strength but has not really noticed a whole lot of improvement with this.  He does have alprazolam as well but is only taken this a couple of times when needed for severe anxiety.  He does not feel that he has time to see a therapist on a regular basis.  ROS:  A comprehensive ROS was completed and negative except as noted per HPI  Allergies  Allergen Reactions   Ibuprofen Other (See Comments)    Gastric bypass surgery, cannot have   Nsaids Other (See Comments)    Gastric bypass surgery, cannot have    Past Medical History:  Diagnosis Date   Chiari malformation     Past Surgical History:  Procedure Laterality Date   ABDOMINAL SURGERY     APPENDECTOMY     COSMETIC SURGERY     body lift   GASTRIC BYPASS  2007    Social History   Socioeconomic History   Marital status: Single    Spouse name: Not on file   Number of children: Not on file   Years of education: Not on file   Highest education level: Not on file  Occupational History   Not on file  Tobacco Use   Smoking status: Never   Smokeless tobacco: Never  Substance and Sexual Activity   Alcohol use: Yes   Drug use: No   Sexual activity: Yes    Birth control/protection: Condom  Other Topics Concern   Not on file  Social History Narrative   Not on file   Social Determinants of Health   Financial Resource Strain: Not on file  Food Insecurity: Not on file  Transportation Needs: Not on file  Physical Activity: Not on file   Stress: Not on file  Social Connections: Not on file    Family History  Problem Relation Age of Onset   Cancer Mother        breast and melanoma    Health Maintenance  Topic Date Due   HIV Screening  Never done   Hepatitis C Screening  Never done   TETANUS/TDAP  09/20/2025   INFLUENZA VACCINE  Completed   COVID-19 Vaccine  Completed   Pneumococcal Vaccine 8-72 Years old  Aged Out   HPV VACCINES  Aged Out     ----------------------------------------------------------------------------------------------------------------------------------------------------------------------------------------------------------------- Physical Exam BP 127/68 (BP Location: Left Arm, Patient Position: Sitting, Cuff Size: Large)   Pulse 74   Ht 6' 0.5" (1.842 m)   Wt 256 lb (116.1 kg)   SpO2 100%   BMI 34.24 kg/m   Physical Exam Constitutional:      Appearance: Normal appearance.  Eyes:     General: No scleral icterus. Neurological:     General: No focal deficit present.     Mental Status: He is alert.  Psychiatric:        Mood and Affect: Mood normal.        Behavior: Behavior normal.    ------------------------------------------------------------------------------------------------------------------------------------------------------------------------------------------------------------------- Assessment and  Plan  GAD (generalized anxiety disorder) He continues to suffer with depression and anxiety.  He is tolerating Lexapro well and will try increasing dose to 20 mg to see if this is more effective for him.  He is using alprazolam minimally.  Declines referral for counseling at this time.   Meds ordered this encounter  Medications   DISCONTD: escitalopram (LEXAPRO) 20 MG tablet    Sig: Take 1 tablet (20 mg total) by mouth daily. Take 5 mg daily x1 week then increase to 10mg  daily    Dispense:  30 tablet    Refill:  3    Return in about 10 weeks (around 11/17/2021) for  MDD/Anxiety-Ok for virtual.    This visit occurred during the SARS-CoV-2 public health emergency.  Safety protocols were in place, including screening questions prior to the visit, additional usage of staff PPE, and extensive cleaning of exam room while observing appropriate contact time as indicated for disinfecting solutions.

## 2021-09-08 NOTE — Assessment & Plan Note (Signed)
He continues to suffer with depression and anxiety.  He is tolerating Lexapro well and will try increasing dose to 20 mg to see if this is more effective for him.  He is using alprazolam minimally.  Declines referral for counseling at this time.

## 2021-09-08 NOTE — Patient Instructions (Signed)
Let's try increasing lexapro to 20mg  daily. Follow up in 10-12 weeks.

## 2021-11-10 ENCOUNTER — Other Ambulatory Visit: Payer: Self-pay

## 2021-11-10 DIAGNOSIS — F411 Generalized anxiety disorder: Secondary | ICD-10-CM

## 2021-11-10 MED ORDER — ESCITALOPRAM OXALATE 20 MG PO TABS: 20.0000 mg | ORAL_TABLET | Freq: Every day | ORAL | 3 refills | Status: DC

## 2021-11-17 ENCOUNTER — Telehealth: Payer: 59 | Admitting: Family Medicine

## 2021-12-03 ENCOUNTER — Telehealth: Payer: Self-pay | Admitting: General Practice

## 2021-12-03 NOTE — Telephone Encounter (Signed)
Transition Care Management Follow-up Telephone Call Date of discharge and from where: 12/02/21 from [place] How have you been since you were released from the hospital? Patient is doing better.  Any questions or concerns? No  Items Reviewed: Did the pt receive and understand the discharge instructions provided? Yes  Medications obtained and verified? No  Other? No  Any new allergies since your discharge? No  Dietary orders reviewed? Yes Do you have support at home? Yes   Home Care and Equipment/Supplies: Were home health services ordered? no  Functional Questionnaire: (I = Independent and D = Dependent) ADLs: I  Bathing/Dressing- I  Meal Prep- I  Eating- I  Maintaining continence- I  Transferring/Ambulation- I  Managing Meds- I  Follow up appointments reviewed:  PCP Hospital f/u appt confirmed? No  Patient did not want to schedule an appointment at this time. [facility] f/u appt confirmed? No   Are transportation arrangements needed? No  If their condition worsens, is the pt aware to call PCP or go to the Emergency Dept.? Yes Was the patient provided with contact information for the PCP's office or ED? Yes Was to pt encouraged to call back with questions or concerns? Yes

## 2021-12-07 ENCOUNTER — Other Ambulatory Visit: Payer: Self-pay

## 2021-12-07 MED ORDER — EUTHYROX 50 MCG PO TABS: 50.0000 ug | ORAL_TABLET | Freq: Every morning | ORAL | 1 refills | Status: DC

## 2021-12-07 NOTE — Telephone Encounter (Signed)
Patient left a vm msg stating he is having an issue with having his thyroid medication delivered to him from [facility]. Patient is requesting the rx to be sent to [facility] in [place]. Unable to send in refill to pharmacy. Written by historical provider.

## 2022-04-21 ENCOUNTER — Encounter: Payer: 59 | Admitting: Family Medicine

## 2022-06-22 ENCOUNTER — Encounter: Payer: 59 | Admitting: Family Medicine

## 2022-07-01 ENCOUNTER — Encounter: Payer: Self-pay | Admitting: Family Medicine

## 2022-07-01 ENCOUNTER — Ambulatory Visit (INDEPENDENT_AMBULATORY_CARE_PROVIDER_SITE_OTHER): Payer: PRIVATE HEALTH INSURANCE | Admitting: Family Medicine

## 2022-07-01 VITALS — BP 129/91 | HR 73 | Ht 72.5 in | Wt 274.0 lb

## 2022-07-01 DIAGNOSIS — F4321 Adjustment disorder with depressed mood: Secondary | ICD-10-CM | POA: Diagnosis not present

## 2022-07-01 DIAGNOSIS — F1090 Alcohol use, unspecified, uncomplicated: Secondary | ICD-10-CM | POA: Diagnosis not present

## 2022-07-01 MED ORDER — NALTREXONE HCL 50 MG PO TABS: 50.0000 mg | ORAL_TABLET | Freq: Every day | ORAL | 1 refills | Status: DC

## 2022-07-01 NOTE — Patient Instructions (Signed)
Start naltrexone '50mg'$  daily. Restart lexapro '10mg'$  x10 days then may increase to '20mg'$ .  Continue with counseling. See me again in 4 weeks.

## 2022-07-02 LAB — COMPLETE METABOLIC PANEL WITH GFR
AG Ratio: 1.9 (calc) (ref 1.0–2.5)
ALT: 15 U/L (ref 9–46)
AST: 16 U/L (ref 10–40)
Albumin: 4.5 g/dL (ref 3.6–5.1)
Alkaline phosphatase (APISO): 57 U/L (ref 36–130)
BUN: 10 mg/dL (ref 7–25)
CO2: 30 mmol/L (ref 20–32)
Calcium: 9.4 mg/dL (ref 8.6–10.3)
Chloride: 98 mmol/L (ref 98–110)
Creat: 0.83 mg/dL (ref 0.60–1.29)
Globulin: 2.4 g/dL (calc) (ref 1.9–3.7)
Glucose, Bld: 102 mg/dL — ABNORMAL HIGH (ref 65–99)
Potassium: 4.1 mmol/L (ref 3.5–5.3)
Sodium: 135 mmol/L (ref 135–146)
Total Bilirubin: 0.9 mg/dL (ref 0.2–1.2)
Total Protein: 6.9 g/dL (ref 6.1–8.1)
eGFR: 110 mL/min/{1.73_m2} (ref >=60)

## 2022-07-02 LAB — CBC WITH DIFFERENTIAL/PLATELET
Absolute Monocytes: 510 cells/uL (ref 200–950)
Basophils Absolute: 78 cells/uL (ref 0–200)
Basophils Relative: 1.3 %
Eosinophils Absolute: 192 cells/uL (ref 15–500)
Eosinophils Relative: 3.2 %
HCT: 49 % (ref 38.5–50.0)
Hemoglobin: 16.8 g/dL (ref 13.2–17.1)
Lymphs Abs: 1854 cells/uL (ref 850–3900)
MCH: 31 pg (ref 27.0–33.0)
MCHC: 34.3 g/dL (ref 32.0–36.0)
MCV: 90.4 fL (ref 80.0–100.0)
MPV: 11.3 fL (ref 7.5–12.5)
Monocytes Relative: 8.5 %
Neutro Abs: 3366 cells/uL (ref 1500–7800)
Neutrophils Relative %: 56.1 %
Platelets: 274 10*3/uL (ref 140–400)
RBC: 5.42 10*6/uL (ref 4.20–5.80)
RDW: 12.5 % (ref 11.0–15.0)
Total Lymphocyte: 30.9 %
WBC: 6 10*3/uL (ref 3.8–10.8)

## 2022-07-03 DIAGNOSIS — F4321 Adjustment disorder with depressed mood: Secondary | ICD-10-CM | POA: Insufficient documentation

## 2022-07-03 DIAGNOSIS — F1090 Alcohol use, unspecified, uncomplicated: Secondary | ICD-10-CM | POA: Insufficient documentation

## 2022-07-03 NOTE — Assessment & Plan Note (Signed)
Grief with depression and anxiety as well as alcohol use.  Recommend continue seeing a grief counselor.  Restarting Lexapro 10 mg x 10 days and increase to 20 mg.  I will plan to see him back again in 4 weeks.

## 2022-07-03 NOTE — Progress Notes (Signed)
Tanner Carroll - 46 y.o. male MRN [id number]  Date of birth: 1976/03/19  Subjective Chief Complaint  Patient presents with   Grief    HPI Tanner Carroll is a 46 year old male here today for visit to discuss depression and anxiety related to grief.  Sadly, his mother passed away late last year from [place].  He has had some difficulty in dealing with this.  Unfortunately, he returned to alcohol for self-management of his symptoms.  He has realized that he has a problem with this.  He has been drinking approximately half of a 1/5 of vodka per night up until recently.  He has abstained for the past 13 days.  He has not had any withdrawal or hallucinations symptoms.  He is seeing a Barrister's clerk.  He is interested in adding naltrexone to help with abstaining from alcohol.  Additionally he would like to add Lexapro back on.  He was previously taking this and felt like it was helpful.  He has had only child and he does not have any close family members in the area and may need to move closer to [place], [place] for better support.  ROS:  A comprehensive ROS was completed and negative except as noted per HPI  Allergies  Allergen Reactions   Ibuprofen Other (See Comments)    Gastric bypass surgery, cannot have   Nsaids Other (See Comments)    Gastric bypass surgery, cannot have    Past Medical History:  Diagnosis Date   Chiari malformation     Past Surgical History:  Procedure Laterality Date   ABDOMINAL SURGERY     APPENDECTOMY     COSMETIC SURGERY     body lift   GASTRIC BYPASS  2007    Social History   Socioeconomic History   Marital status: Single    Spouse name: Not on file   Number of children: Not on file   Years of education: Not on file   Highest education level: Not on file  Occupational History   Not on file  Tobacco Use   Smoking status: Never   Smokeless tobacco: Never  Substance and Sexual Activity   Alcohol use: Yes   Drug use: No   Sexual activity: Yes     Birth control/protection: Condom  Other Topics Concern   Not on file  Social History Narrative   Not on file   Social Determinants of Health   Financial Resource Strain: Not on file  Food Insecurity: Not on file  Transportation Needs: Not on file  Physical Activity: Not on file  Stress: Not on file  Social Connections: Not on file    Family History  Problem Relation Age of Onset   Cancer Mother        breast and melanoma    Health Maintenance  Topic Date Due   COVID-19 Vaccine (4 - Moderna series) 11/21/2022 (Originally 12/06/2020)   INFLUENZA VACCINE  02/19/2023 (Originally 06/21/2022)   COLONOSCOPY (Pts 45-91yr Insurance coverage will need to be confirmed)  07/02/2023 (Originally 01/13/2022)   Hepatitis C Screening  07/02/2023 (Originally 01/13/1995)   HIV Screening  07/02/2023 (Originally 01/14/1992)   TETANUS/TDAP  09/20/2025   HPV VACCINES  Aged Out     ----------------------------------------------------------------------------------------------------------------------------------------------------------------------------------------------------------------- Physical Exam BP (!) 129/91 (BP Location: Left Arm, Patient Position: Sitting, Cuff Size: Large)   Pulse 73   Ht 6' 0.5" (1.842 m)   Wt 274 lb (124.3 kg)   SpO2 99%   BMI 36.65 kg/m   Physical  Exam Constitutional:      Appearance: Normal appearance.  Musculoskeletal:     Cervical back: Neck supple.  Neurological:     Mental Status: He is alert.  Psychiatric:     Comments: Anxious, sad.  Appropriately tearful.     ------------------------------------------------------------------------------------------------------------------------------------------------------------------------------------------------------------------- Assessment and Plan  Complicated grief Grief with depression and anxiety as well as alcohol use.  Recommend continue seeing a grief counselor.  Restarting Lexapro 10 mg x 10 days and  increase to 20 mg.  I will plan to see him back again in 4 weeks.  Alcohol use disorder Has been able to abstain over the past 13 days.  Congratulated on remain quit for this period.  Adding naltrexone 50 mg daily to help him remain sober.  He has had no withdrawal symptoms.  At this point I do not think he needs a Librium taper.  Labs ordered to evaluate LFTs.   Meds ordered this encounter  Medications   naltrexone (DEPADE) 50 MG tablet    Sig: Take 1 tablet (50 mg total) by mouth daily.    Dispense:  30 tablet    Refill:  1    Return in about 4 weeks (around 07/29/2022) for Grief/Mood.    This visit occurred during the SARS-CoV-2 public health emergency.  Safety protocols were in place, including screening questions prior to the visit, additional usage of staff PPE, and extensive cleaning of exam room while observing appropriate contact time as indicated for disinfecting solutions.

## 2022-07-03 NOTE — Assessment & Plan Note (Addendum)
Has been able to abstain over the past 13 days.  Congratulated on remain quit for this period.  Adding naltrexone 50 mg daily to help him remain sober.  He has had no withdrawal symptoms.  At this point I do not think he needs a Librium taper.  Labs ordered to evaluate LFTs.

## 2022-07-29 ENCOUNTER — Encounter: Payer: Self-pay | Admitting: Family Medicine

## 2022-07-29 ENCOUNTER — Ambulatory Visit (INDEPENDENT_AMBULATORY_CARE_PROVIDER_SITE_OTHER): Payer: PRIVATE HEALTH INSURANCE | Admitting: Family Medicine

## 2022-07-29 VITALS — BP 122/73 | HR 60 | Ht 72.5 in | Wt 273.0 lb

## 2022-07-29 DIAGNOSIS — F411 Generalized anxiety disorder: Secondary | ICD-10-CM

## 2022-07-29 DIAGNOSIS — Z1211 Encounter for screening for malignant neoplasm of colon: Secondary | ICD-10-CM

## 2022-07-29 DIAGNOSIS — F1090 Alcohol use, unspecified, uncomplicated: Secondary | ICD-10-CM | POA: Diagnosis not present

## 2022-07-29 MED ORDER — NALTREXONE HCL 50 MG PO TABS: 50.0000 mg | ORAL_TABLET | Freq: Every day | ORAL | 1 refills | Status: DC

## 2022-07-29 NOTE — Assessment & Plan Note (Signed)
Doing well with naltrexone.  Denies cravings.  Will continue at current strength.

## 2022-07-29 NOTE — Progress Notes (Signed)
Tanner Carroll - 46 y.o. male MRN [id number]  Date of birth: Dec 02, 1975  Subjective Chief Complaint  Patient presents with   grief   Mood    HPI Tanner Carroll is a 46 y.o. male here today for follow up visit.    Reports that he is doing pretty good today. He has restarted lexapro and this seems to be helping. He has only taken this for a couple of weeks.  He is not seeing a Barrister's clerk.  He has been able to abstain from EtOH. Denies cravings.  He continues on Naltrexone which is  working well and he is tolerating well.  He would like to continue this.   ROS:  A comprehensive ROS was completed and negative except as noted per HPI  Allergies  Allergen Reactions   Ibuprofen Other (See Comments)    Gastric bypass surgery, cannot have   Nsaids Other (See Comments)    Gastric bypass surgery, cannot have    Past Medical History:  Diagnosis Date   Chiari malformation     Past Surgical History:  Procedure Laterality Date   ABDOMINAL SURGERY     APPENDECTOMY     COSMETIC SURGERY     body lift   GASTRIC BYPASS  2007    Social History   Socioeconomic History   Marital status: Single    Spouse name: Not on file   Number of children: Not on file   Years of education: Not on file   Highest education level: Not on file  Occupational History   Not on file  Tobacco Use   Smoking status: Never   Smokeless tobacco: Never  Substance and Sexual Activity   Alcohol use: Yes   Drug use: No   Sexual activity: Yes    Birth control/protection: Condom  Other Topics Concern   Not on file  Social History Narrative   Not on file   Social Determinants of Health   Financial Resource Strain: Not on file  Food Insecurity: Not on file  Transportation Needs: Not on file  Physical Activity: Not on file  Stress: Not on file  Social Connections: Not on file    Family History  Problem Relation Age of Onset   Cancer Mother        breast and melanoma    Health Maintenance  Topic  Date Due   COVID-19 Vaccine (4 - Moderna series) 11/21/2022 (Originally 12/06/2020)   INFLUENZA VACCINE  02/19/2023 (Originally 06/21/2022)   COLONOSCOPY (Pts 45-52yr Insurance coverage will need to be confirmed)  07/02/2023 (Originally 01/13/2022)   Hepatitis C Screening  07/02/2023 (Originally 01/13/1995)   HIV Screening  07/02/2023 (Originally 01/14/1992)   TETANUS/TDAP  09/20/2025   HPV VACCINES  Aged Out     ----------------------------------------------------------------------------------------------------------------------------------------------------------------------------------------------------------------- Physical Exam BP 122/73 (BP Location: Left Arm, Patient Position: Sitting, Cuff Size: Large)   Pulse 60   Ht 6' 0.5" (1.842 m)   Wt 273 lb (123.8 kg)   SpO2 97%   BMI 36.52 kg/m   Physical Exam Constitutional:      Appearance: Normal appearance.  Eyes:     General: No scleral icterus. Musculoskeletal:     Cervical back: Neck supple.  Neurological:     Mental Status: He is alert.  Psychiatric:        Mood and Affect: Mood normal.        Behavior: Behavior normal.     ------------------------------------------------------------------------------------------------------------------------------------------------------------------------------------------------------------------- Assessment and Plan  GAD (generalized anxiety disorder) Seeing some improvement  with lexapro.  Continue at current strength.   Alcohol use disorder Doing well with naltrexone.  Denies cravings.  Will continue at current strength.     Meds ordered this encounter  Medications   naltrexone (DEPADE) 50 MG tablet    Sig: Take 1 tablet (50 mg total) by mouth daily.    Dispense:  90 tablet    Refill:  1    Return in about 2 months (around 09/28/2022) for F/u EtOH/anxiety.    This visit occurred during the SARS-CoV-2 public health emergency.  Safety protocols were in place, including  screening questions prior to the visit, additional usage of staff PPE, and extensive cleaning of exam room while observing appropriate contact time as indicated for disinfecting solutions.

## 2022-07-29 NOTE — Assessment & Plan Note (Signed)
Seeing some improvement with lexapro.  Continue at current strength.

## 2022-08-04 ENCOUNTER — Encounter: Payer: Self-pay | Admitting: Internal Medicine

## 2022-08-16 ENCOUNTER — Telehealth: Payer: Self-pay | Admitting: *Deleted

## 2022-08-16 NOTE — Telephone Encounter (Signed)
Osvaldo Angst CRNA,  Please review pt's health history-Chiari Malformation. See head CT scan also. Okay for [facility] colonoscopy? Please advise. Thank you, Kaye Mitro PV

## 2022-08-17 ENCOUNTER — Ambulatory Visit (INDEPENDENT_AMBULATORY_CARE_PROVIDER_SITE_OTHER): Payer: PRIVATE HEALTH INSURANCE | Admitting: Family Medicine

## 2022-08-17 VITALS — Temp 98.3°F

## 2022-08-17 DIAGNOSIS — Z23 Encounter for immunization: Secondary | ICD-10-CM | POA: Diagnosis not present

## 2022-08-17 NOTE — Telephone Encounter (Signed)
May help to confirm if patient really has a type I chiari malformation or not. On quick review of chart CT head Jan 2023 doesn't mention any chiari malformation.

## 2022-08-17 NOTE — Telephone Encounter (Signed)
Shirlean Mylar, As this patient has not been seen in the practice previously, okay to schedule screening colonoscopy with myself or any other provider, at the hospital, based on availability. Thanks, Dr. Scarlette Shorts, We will add Chiari malformation to our ever growing [place] exclusion criteria list. Thanks, Dr. Henrene Pastor

## 2022-08-17 NOTE — Telephone Encounter (Signed)
Tanner Carroll,  All [facility] Chiari pt's have to be considered difficult intubations, so his procedure will need to be done at the hospital.    Thanks,  Osvaldo Angst

## 2022-08-17 NOTE — Telephone Encounter (Signed)
Dr.Perry,  This patient is scheduled for a direct screening colonoscopy with you. See phone note from John Nulty,CRNA. Okay for direct hospital colonoscopy or OV? Please advise. Thank you, Eliav Mechling PV

## 2022-08-18 NOTE — Telephone Encounter (Signed)
Tanner Carroll, Could you set this up at the hospital please?   Thanks

## 2022-08-19 ENCOUNTER — Other Ambulatory Visit: Payer: Self-pay

## 2022-08-19 DIAGNOSIS — Z1211 Encounter for screening for malignant neoplasm of colon: Secondary | ICD-10-CM

## 2022-08-19 NOTE — Telephone Encounter (Signed)
Pt scheduled for Colon at [facility] 10/06/22 at 9:45am. [phone]

## 2022-08-19 NOTE — Telephone Encounter (Signed)
Patient called and notified of recommendations. Pt aware of [facility] appointment date 11/16 at 945 am. Pt aware PV is the same 10/6.

## 2022-08-26 ENCOUNTER — Ambulatory Visit (AMBULATORY_SURGERY_CENTER): Payer: Self-pay

## 2022-08-26 VITALS — Ht 72.5 in | Wt 270.0 lb

## 2022-08-26 DIAGNOSIS — Z1211 Encounter for screening for malignant neoplasm of colon: Secondary | ICD-10-CM

## 2022-08-26 MED ORDER — NA SULFATE-K SULFATE-MG SULF 17.5-3.13-1.6 GM/177ML PO SOLN: 1.0000 | ORAL | 0 refills | Status: DC

## 2022-08-26 NOTE — Progress Notes (Signed)
No egg or soy allergy known to patient  No issues known to pt with past sedation with any surgeries or procedures Patient denies ever being told they had issues or difficulty with intubation  No FH of Malignant Hyperthermia Pt is not on diet pills Pt is not on  home 02  Pt is not on blood thinners  Pt denies issues with constipation  No A fib or A flutter Have any cardiac testing pending--denied Pt instructed to use [email or web address] or GoodRx for a price reduction on prep   

## 2022-08-27 ENCOUNTER — Encounter: Payer: Self-pay | Admitting: Family Medicine

## 2022-08-29 MED ORDER — EUTHYROX 50 MCG PO TABS: 50.0000 ug | ORAL_TABLET | Freq: Every morning | ORAL | 0 refills | Status: DC

## 2022-09-19 ENCOUNTER — Encounter: Payer: PRIVATE HEALTH INSURANCE | Admitting: Internal Medicine

## 2022-09-28 ENCOUNTER — Ambulatory Visit (INDEPENDENT_AMBULATORY_CARE_PROVIDER_SITE_OTHER): Payer: PRIVATE HEALTH INSURANCE | Admitting: Family Medicine

## 2022-09-28 ENCOUNTER — Encounter: Payer: Self-pay | Admitting: Family Medicine

## 2022-09-28 ENCOUNTER — Encounter (HOSPITAL_COMMUNITY): Payer: Self-pay | Admitting: Internal Medicine

## 2022-09-28 VITALS — BP 110/75 | HR 61 | Ht 72.5 in | Wt 273.0 lb

## 2022-09-28 DIAGNOSIS — Z13220 Encounter for screening for lipoid disorders: Secondary | ICD-10-CM | POA: Diagnosis not present

## 2022-09-28 DIAGNOSIS — F4321 Adjustment disorder with depressed mood: Secondary | ICD-10-CM

## 2022-09-28 DIAGNOSIS — Z8639 Personal history of other endocrine, nutritional and metabolic disease: Secondary | ICD-10-CM

## 2022-09-28 DIAGNOSIS — F1090 Alcohol use, unspecified, uncomplicated: Secondary | ICD-10-CM | POA: Diagnosis not present

## 2022-09-28 DIAGNOSIS — R739 Hyperglycemia, unspecified: Secondary | ICD-10-CM

## 2022-09-28 DIAGNOSIS — E039 Hypothyroidism, unspecified: Secondary | ICD-10-CM

## 2022-09-28 NOTE — Patient Instructions (Signed)
Let's continue current medications.  We'll be in touch with lab results.  Follow up in 3-4 months.

## 2022-10-02 DIAGNOSIS — E039 Hypothyroidism, unspecified: Secondary | ICD-10-CM | POA: Insufficient documentation

## 2022-10-02 NOTE — Progress Notes (Signed)
Tanner Carroll - 46 y.o. male MRN [id number]  Date of birth: Mar 21, 1976  Subjective Chief Complaint  Patient presents with   Alcohol Problem    HPI Tanner Carroll is a 46 year old male here today for follow-up visit.  He reports he is doing pretty well at this time.  He has been able to control his alcohol intake much better over the past few months.  Tolerating naltrexone well and feels that this has been effective in helping him reduce his alcohol intake.  He does continue to grieve and remains involved with grief counseling program.  He would like to have updated labs at this time.  He does feel that Lexapro at current strength is working well for his depression and anxiety.  Continues to feel well with current strength of levothyroxine..  ROS:  A comprehensive ROS was completed and negative except as noted per HPI  Allergies  Allergen Reactions   Ibuprofen Other (See Comments)    Gastric bypass surgery, cannot have   Nsaids Other (See Comments)    Gastric bypass surgery, cannot have    Past Medical History:  Diagnosis Date   Chiari malformation     Past Surgical History:  Procedure Laterality Date   ABDOMINAL SURGERY     APPENDECTOMY     COSMETIC SURGERY     body lift   GASTRIC BYPASS  2007    Social History   Socioeconomic History   Marital status: Single    Spouse name: Not on file   Number of children: Not on file   Years of education: Not on file   Highest education level: Not on file  Occupational History   Not on file  Tobacco Use   Smoking status: Never   Smokeless tobacco: Never  Substance and Sexual Activity   Alcohol use: Yes   Drug use: No   Sexual activity: Yes    Birth control/protection: Condom  Other Topics Concern   Not on file  Social History Narrative   Not on file   Social Determinants of Health   Financial Resource Strain: Not on file  Food Insecurity: Not on file  Transportation Needs: Not on file  Physical Activity: Not on file   Stress: Not on file  Social Connections: Not on file    Family History  Problem Relation Age of Onset   Cancer Mother        breast and melanoma   Colon cancer Neg Hx    Stomach cancer Neg Hx    Esophageal cancer Neg Hx     Health Maintenance  Topic Date Due   COVID-19 Vaccine (4 - Moderna series) 11/21/2022 (Originally 12/06/2020)   COLONOSCOPY (Pts 45-7yr Insurance coverage will need to be confirmed)  07/02/2023 (Originally 01/13/2022)   Hepatitis C Screening  07/02/2023 (Originally 01/13/1995)   HIV Screening  07/02/2023 (Originally 01/14/1992)   TETANUS/TDAP  09/20/2025   INFLUENZA VACCINE  Completed   HPV VACCINES  Aged Out     ----------------------------------------------------------------------------------------------------------------------------------------------------------------------------------------------------------------- Physical Exam BP 110/75 (BP Location: Left Arm, Patient Position: Sitting, Cuff Size: Normal)   Pulse 61   Ht 6' 0.5" (1.842 m)   Wt 273 lb (123.8 kg)   SpO2 99%   BMI 36.52 kg/m   Physical Exam Constitutional:      Appearance: Normal appearance.  HENT:     Head: Normocephalic and atraumatic.  Eyes:     General: No scleral icterus. Cardiovascular:     Rate and Rhythm: Normal rate and regular rhythm.  Pulmonary:     Effort: Pulmonary effort is normal.     Breath sounds: Normal breath sounds.  Musculoskeletal:     Cervical back: Neck supple.  Neurological:     Mental Status: He is alert.  Psychiatric:        Mood and Affect: Mood normal.        Behavior: Behavior normal.     ------------------------------------------------------------------------------------------------------------------------------------------------------------------------------------------------------------------- Assessment and Plan  Complicated grief He continues to do well with Lexapro at current strength to help with his symptoms of anxiety as well as  dealing with his grief.  We will plan to continue this at current strength.  Continue grief counseling at this time.  Alcohol use disorder He has been able to control his alcohol use with naltrexone as well as better management of his anxiety.  We will continue naltrexone at current strength for now.  Acquired hypothyroidism Update TSH today.   No orders of the defined types were placed in this encounter.   Return in about 3 months (around 12/29/2022) for F/u Mood.    This visit occurred during the SARS-CoV-2 public health emergency.  Safety protocols were in place, including screening questions prior to the visit, additional usage of staff PPE, and extensive cleaning of exam room while observing appropriate contact time as indicated for disinfecting solutions.

## 2022-10-02 NOTE — Assessment & Plan Note (Signed)
He has been able to control his alcohol use with naltrexone as well as better management of his anxiety.  We will continue naltrexone at current strength for now.

## 2022-10-02 NOTE — Assessment & Plan Note (Signed)
Update TSH today.

## 2022-10-02 NOTE — Assessment & Plan Note (Signed)
He continues to do well with Lexapro at current strength to help with his symptoms of anxiety as well as dealing with his grief.  We will plan to continue this at current strength.  Continue grief counseling at this time.

## 2022-10-03 ENCOUNTER — Encounter: Payer: Self-pay | Admitting: Internal Medicine

## 2022-10-03 LAB — COMPLETE METABOLIC PANEL WITH GFR
AG Ratio: 2 (calc) (ref 1.0–2.5)
ALT: 20 U/L (ref 9–46)
AST: 17 U/L (ref 10–40)
Albumin: 4.2 g/dL (ref 3.6–5.1)
Alkaline phosphatase (APISO): 55 U/L (ref 36–130)
BUN: 10 mg/dL (ref 7–25)
CO2: 32 mmol/L (ref 20–32)
Calcium: 9.1 mg/dL (ref 8.6–10.3)
Chloride: 101 mmol/L (ref 98–110)
Creat: 0.73 mg/dL (ref 0.60–1.29)
Globulin: 2.1 g/dL (calc) (ref 1.9–3.7)
Glucose, Bld: 100 mg/dL — ABNORMAL HIGH (ref 65–99)
Potassium: 4.5 mmol/L (ref 3.5–5.3)
Sodium: 140 mmol/L (ref 135–146)
Total Bilirubin: 0.8 mg/dL (ref 0.2–1.2)
Total Protein: 6.3 g/dL (ref 6.1–8.1)
eGFR: 114 mL/min/{1.73_m2} (ref >=60)

## 2022-10-03 LAB — CBC WITH DIFFERENTIAL/PLATELET
Absolute Monocytes: 429 cells/uL (ref 200–950)
Basophils Absolute: 58 cells/uL (ref 0–200)
Basophils Relative: 1.1 %
Eosinophils Absolute: 270 cells/uL (ref 15–500)
Eosinophils Relative: 5.1 %
HCT: 43.8 % (ref 38.5–50.0)
Hemoglobin: 15.2 g/dL (ref 13.2–17.1)
Lymphs Abs: 1844 cells/uL (ref 850–3900)
MCH: 30.7 pg (ref 27.0–33.0)
MCHC: 34.7 g/dL (ref 32.0–36.0)
MCV: 88.5 fL (ref 80.0–100.0)
MPV: 12 fL (ref 7.5–12.5)
Monocytes Relative: 8.1 %
Neutro Abs: 2698 cells/uL (ref 1500–7800)
Neutrophils Relative %: 50.9 %
Platelets: 247 10*3/uL (ref 140–400)
RBC: 4.95 10*6/uL (ref 4.20–5.80)
RDW: 12.2 % (ref 11.0–15.0)
Total Lymphocyte: 34.8 %
WBC: 5.3 10*3/uL (ref 3.8–10.8)

## 2022-10-03 LAB — VITAMIN D 25 HYDROXY (VIT D DEFICIENCY, FRACTURES): Vit D, 25-Hydroxy: 50 ng/mL (ref 30–100)

## 2022-10-03 LAB — LIPID PANEL W/REFLEX DIRECT LDL
Cholesterol: 173 mg/dL (ref <200)
HDL: 60 mg/dL (ref >=40)
LDL Cholesterol (Calc): 96 mg/dL (calc)
Non-HDL Cholesterol (Calc): 113 mg/dL (calc) (ref <130)
Total CHOL/HDL Ratio: 2.9 (calc) (ref <5.0)
Triglycerides: 77 mg/dL (ref <150)

## 2022-10-03 LAB — HEMOGLOBIN A1C
Hgb A1c MFr Bld: 5.7 % of total Hgb — ABNORMAL HIGH (ref <5.7)
Mean Plasma Glucose: 117 mg/dL
eAG (mmol/L): 6.5 mmol/L

## 2022-10-03 LAB — VITAMIN B1: Vitamin B1 (Thiamine): 26 nmol/L (ref 8–30)

## 2022-10-03 LAB — VITAMIN B12: Vitamin B-12: 678 pg/mL (ref 200–1100)

## 2022-10-03 LAB — TSH: TSH: 2.61 mIU/L (ref 0.40–4.50)

## 2022-10-06 ENCOUNTER — Ambulatory Visit (HOSPITAL_BASED_OUTPATIENT_CLINIC_OR_DEPARTMENT_OTHER): Payer: PRIVATE HEALTH INSURANCE | Admitting: Anesthesiology

## 2022-10-06 ENCOUNTER — Encounter (HOSPITAL_COMMUNITY): Payer: Self-pay | Admitting: Internal Medicine

## 2022-10-06 ENCOUNTER — Ambulatory Visit (HOSPITAL_COMMUNITY)
Admission: RE | Admit: 2022-10-06 | Discharge: 2022-10-06 | Disposition: A | Discharge status: Home / Self Care | Payer: PRIVATE HEALTH INSURANCE | Attending: Internal Medicine | Admitting: Internal Medicine

## 2022-10-06 ENCOUNTER — Other Ambulatory Visit: Payer: Self-pay

## 2022-10-06 ENCOUNTER — Ambulatory Visit (HOSPITAL_COMMUNITY): Payer: PRIVATE HEALTH INSURANCE | Admitting: Anesthesiology

## 2022-10-06 ENCOUNTER — Encounter (HOSPITAL_COMMUNITY)
Admission: RE | Disposition: A | Discharge status: Home / Self Care | Payer: Self-pay | Source: Home / Self Care | Attending: Internal Medicine

## 2022-10-06 DIAGNOSIS — Z6835 Body mass index (BMI) 35.0-35.9, adult: Secondary | ICD-10-CM | POA: Diagnosis not present

## 2022-10-06 DIAGNOSIS — D128 Benign neoplasm of rectum: Secondary | ICD-10-CM | POA: Diagnosis not present

## 2022-10-06 DIAGNOSIS — F419 Anxiety disorder, unspecified: Secondary | ICD-10-CM | POA: Diagnosis not present

## 2022-10-06 DIAGNOSIS — Z9884 Bariatric surgery status: Secondary | ICD-10-CM | POA: Insufficient documentation

## 2022-10-06 DIAGNOSIS — E039 Hypothyroidism, unspecified: Secondary | ICD-10-CM | POA: Diagnosis not present

## 2022-10-06 DIAGNOSIS — Z1212 Encounter for screening for malignant neoplasm of rectum: Secondary | ICD-10-CM | POA: Diagnosis not present

## 2022-10-06 DIAGNOSIS — Z1211 Encounter for screening for malignant neoplasm of colon: Secondary | ICD-10-CM

## 2022-10-06 DIAGNOSIS — E669 Obesity, unspecified: Secondary | ICD-10-CM | POA: Insufficient documentation

## 2022-10-06 HISTORY — PX: COLONOSCOPY WITH PROPOFOL: SHX5780

## 2022-10-06 HISTORY — PX: POLYPECTOMY: SHX5525

## 2022-10-06 SURGERY — COLONOSCOPY WITH PROPOFOL
Anesthesia: Monitor Anesthesia Care

## 2022-10-06 MED ORDER — PROPOFOL 1000 MG/100ML IV EMUL
INTRAVENOUS | Status: AC
  Filled 2022-10-06: qty 100

## 2022-10-06 MED ORDER — PROPOFOL 10 MG/ML IV BOLUS
INTRAVENOUS | Status: DC | PRN
  Administered 2022-10-06 (×5): 20 mg via INTRAVENOUS

## 2022-10-06 MED ORDER — LACTATED RINGERS IV SOLN: INTRAVENOUS | Status: DC

## 2022-10-06 MED ORDER — LIDOCAINE 2% (20 MG/ML) 5 ML SYRINGE
INTRAMUSCULAR | Status: DC | PRN
  Administered 2022-10-06: 100 mg via INTRAVENOUS

## 2022-10-06 MED ORDER — PROPOFOL 500 MG/50ML IV EMUL
INTRAVENOUS | Status: DC | PRN
  Administered 2022-10-06: 125 ug/kg/min via INTRAVENOUS

## 2022-10-06 MED ORDER — SODIUM CHLORIDE 0.9 % IV SOLN: INTRAVENOUS | Status: DC

## 2022-10-06 SURGICAL SUPPLY — 22 items

## 2022-10-06 NOTE — Op Note (Signed)
[facility] Patient Name: Tanner Carroll Procedure Date: 10/06/2022 MRN: [id number] Attending MD: Docia Chuck. Henrene Pastor , MD, [id number] Date of Birth: 04/27/1976 CSN: [id number] Age: 46 Admit Type: Outpatient Procedure:                Colonoscopy with cold snare polypectomy x 3 Indications:              Screening for colorectal malignant neoplasm Providers:                Docia Chuck. Henrene Pastor, MD, Benay Pillow, RN, Cletis Athens,                            Technician Referring MD:             Rosanne Gutting, DO Medicines:                Monitored Anesthesia Care Complications:            No immediate complications. Estimated blood loss:                            None. Estimated Blood Loss:     Estimated blood loss: none. Procedure:                Pre-Anesthesia Assessment:                           - Prior to the procedure, a History and Physical                            was performed, and patient medications and                            allergies were reviewed. The patient's tolerance of                            previous anesthesia was also reviewed. The risks                            and benefits of the procedure and the sedation                            options and risks were discussed with the patient.                            All questions were answered, and informed consent                            was obtained. Prior Anticoagulants: The patient has                            taken no anticoagulant or antiplatelet agents.                            After reviewing the risks and benefits, the patient  was deemed in satisfactory condition to undergo the                            procedure.                           After obtaining informed consent, the colonoscope                            was passed under direct vision. Throughout the                            procedure, the patient's blood pressure, pulse, and                             oxygen saturations were monitored continuously. The                            CF-HQ190L ([id number] Olympus colonoscope was                            introduced through the anus and advanced to the the                            cecum, identified by appendiceal orifice and                            ileocecal valve. The ileocecal valve, appendiceal                            orifice, and rectum were photographed. The quality                            of the bowel preparation was good. The colonoscopy                            was performed without difficulty. The patient                            tolerated the procedure well. The bowel preparation                            used was SUPREP via split dose instruction. Scope In: 9:32:32 AM Scope Out: 10:03:42 AM Scope Withdrawal Time: 0 hours 22 minutes 19 seconds  Total Procedure Duration: 0 hours 31 minutes 10 seconds  Findings:      Three sessile polyps were found in the rectum. The polyps were 2 to 12       mm in size. These polyps were removed with a cold snare. Resection and       retrieval were complete.      The exam was otherwise without abnormality on direct and retroflexion       views. Impression:               - Three 2 to 12 mm polyps in the rectum, removed  with a cold snare. Resected and retrieved.                           - The examination was otherwise normal on direct                            and retroflexion views. Moderate Sedation:      none Recommendation:           - Repeat colonoscopy in 3 years for surveillance.                           - Patient has a contact number available for                            emergencies. The signs and symptoms of potential                            delayed complications were discussed with the                            patient. Return to normal activities tomorrow.                            Written discharge instructions were provided to the                             patient.                           - Resume previous diet.                           - Continue present medications.                           - Await pathology results. Procedure Code(s):        --- Professional ---                           [phone], Colonoscopy, flexible; with removal of                            tumor(s), polyp(s), or other lesion(s) by snare                            technique Diagnosis Code(s):        --- Professional ---                           Z12.11, Encounter for screening for malignant                            neoplasm of colon                           D12.8, Benign neoplasm of rectum CPT copyright 2022 American  Medical Association. All rights reserved. The codes documented in this report are preliminary and upon coder review may  be revised to meet current compliance requirements. Docia Chuck. Henrene Pastor, MD 10/06/2022 10:13:33 AM This report has been signed electronically. Number of Addenda: 0

## 2022-10-06 NOTE — Transfer of Care (Signed)
Immediate Anesthesia Transfer of Care Note  Patient: Tanner Carroll  Procedure(s) Performed: COLONOSCOPY WITH PROPOFOL POLYPECTOMY  Patient Location: Endoscopy Unit  Anesthesia Type:MAC  Level of Consciousness: awake  Airway & Oxygen Therapy: Patient Spontanous Breathing and Patient connected to face mask oxygen  Post-op Assessment: Report given to RN and Post -op Vital signs reviewed and stable  Post vital signs: Reviewed and stable  Last Vitals:  Vitals Value Taken Time  BP    Temp    Pulse    Resp    SpO2      Last Pain:  Vitals:   10/06/22 0743  TempSrc: Temporal  PainSc: 0-No pain         Complications: No notable events documented.

## 2022-10-06 NOTE — Anesthesia Postprocedure Evaluation (Signed)
Anesthesia Post Note  Patient: Tanner Carroll  Procedure(s) Performed: COLONOSCOPY WITH PROPOFOL POLYPECTOMY     Patient location during evaluation: PACU Anesthesia Type: MAC Level of consciousness: awake and alert Pain management: pain level controlled Vital Signs Assessment: post-procedure vital signs reviewed and stable Respiratory status: spontaneous breathing, nonlabored ventilation and respiratory function stable Cardiovascular status: blood pressure returned to baseline and stable Postop Assessment: no apparent nausea or vomiting Anesthetic complications: no   No notable events documented.  Last Vitals:  Vitals:   10/06/22 1018 10/06/22 1028  BP: 132/72 124/70  Pulse: 64 65  Resp: (!) 22 16  Temp:    SpO2: 100% 100%    Last Pain:  Vitals:   10/06/22 1028  TempSrc:   PainSc: 0-No pain                 Lynda Rainwater

## 2022-10-06 NOTE — Anesthesia Preprocedure Evaluation (Signed)
Anesthesia Evaluation  Patient identified by MRN, date of birth, ID band Patient awake    Reviewed: Allergy & Precautions, H&P , NPO status , Patient's Chart, lab work & pertinent test results  Airway Mallampati: II  TM Distance: >3 FB Neck ROM: Full    Dental no notable dental hx.    Pulmonary neg pulmonary ROS   Pulmonary exam normal breath sounds clear to auscultation       Cardiovascular negative cardio ROS Normal cardiovascular exam Rhythm:Regular Rate:Normal     Neuro/Psych   Anxiety     negative neurological ROS  negative psych ROS   GI/Hepatic negative GI ROS, Neg liver ROS,,,  Endo/Other  Hypothyroidism    Renal/GU negative Renal ROS  negative genitourinary   Musculoskeletal negative musculoskeletal ROS (+)    Abdominal  (+) + obese  Peds negative pediatric ROS (+)  Hematology negative hematology ROS (+)   Anesthesia Other Findings   Reproductive/Obstetrics negative OB ROS                             Anesthesia Physical Anesthesia Plan  ASA: 2  Anesthesia Plan: MAC   Post-op Pain Management: Minimal or no pain anticipated   Induction: Intravenous  PONV Risk Score and Plan: 1 and Ondansetron and Treatment may vary due to age or medical condition  Airway Management Planned: Simple Face Mask  Additional Equipment:   Intra-op Plan:   Post-operative Plan:   Informed Consent: I have reviewed the patients History and Physical, chart, labs and discussed the procedure including the risks, benefits and alternatives for the proposed anesthesia with the patient or authorized representative who has indicated his/her understanding and acceptance.     Dental advisory given  Plan Discussed with: CRNA  Anesthesia Plan Comments:        Anesthesia Quick Evaluation

## 2022-10-06 NOTE — H&P (Signed)
HISTORY OF PRESENT ILLNESS:  Tanner Carroll is a 46 y.o. male was referred for routine screening colonoscopy.  No family history of colon cancer.  No symptoms or signs.  Average risk for colorectal neoplasia  REVIEW OF SYSTEMS:  All non-GI ROS negative. Past Medical History:  Diagnosis Date   Chiari malformation     Past Surgical History:  Procedure Laterality Date   ABDOMINAL SURGERY     APPENDECTOMY     COSMETIC SURGERY     body lift   GASTRIC BYPASS  2007    Social History Tanner Carroll  reports that he has never smoked. He has never used smokeless tobacco. He reports current alcohol use. He reports that he does not use drugs.  family history includes Cancer in his mother.  Allergies  Allergen Reactions   Ibuprofen Other (See Comments)    Gastric bypass surgery, cannot have   Nsaids Other (See Comments)    Gastric bypass surgery, cannot have       PHYSICAL EXAMINATION: Vital signs: BP 131/79   Pulse 71   Temp (!) 97 F (36.1 C) (Temporal)   Resp (!) 21   Ht 6' (1.829 m)   Wt 117.9 kg   SpO2 100%   BMI 35.26 kg/m  General: Well-developed, well-nourished, no acute distress HEENT: Sclerae are anicteric, conjunctiva pink. Oral mucosa intact Lungs: Clear Heart: Regular Abdomen: soft, nontender, nondistended, no obvious ascites, no peritoneal signs, normal bowel sounds. No organomegaly. Extremities: No edema Psychiatric: alert and oriented x3. Cooperative     ASSESSMENT:  Colon cancer screening   PLAN:   Screening colonoscopy

## 2022-10-06 NOTE — Discharge Instructions (Signed)
YOU HAD AN ENDOSCOPIC PROCEDURE TODAY: Refer to the procedure report and other information in the discharge instructions given to you for any specific questions about what was found during the examination. If this information does not answer your questions, please call [place] office at [phone] to clarify.  ° °YOU SHOULD EXPECT: Some feelings of bloating in the abdomen. Passage of more gas than usual. Walking can help get rid of the air that was put into your GI tract during the procedure and reduce the bloating. If you had a lower endoscopy (such as a colonoscopy or flexible sigmoidoscopy) you may notice spotting of blood in your stool or on the toilet paper. Some abdominal soreness may be present for a day or two, also. ° °DIET: Your first meal following the procedure should be a light meal and then it is ok to progress to your normal diet. A half-sandwich or bowl of soup is an example of a good first meal. Heavy or fried foods are harder to digest and may make you feel nauseous or bloated. Drink plenty of fluids but you should avoid alcoholic beverages for 24 hours. If you had a esophageal dilation, please see attached instructions for diet.   ° °ACTIVITY: Your care partner should take you home directly after the procedure. You should plan to take it easy, moving slowly for the rest of the day. You can resume normal activity the day after the procedure however YOU SHOULD NOT DRIVE, use power tools, machinery or perform tasks that involve climbing or major physical exertion for 24 hours (because of the sedation medicines used during the test).  ° °SYMPTOMS TO REPORT IMMEDIATELY: °A gastroenterologist can be reached at any hour. Please call [phone]  for any of the following symptoms:  °Following lower endoscopy (colonoscopy, flexible sigmoidoscopy) °Excessive amounts of blood in the stool  °Significant tenderness, worsening of abdominal pains  °Swelling of the abdomen that is new, acute  °Fever of 100° or  higher  °Following upper endoscopy (EGD, EUS, ERCP, esophageal dilation) °Vomiting of blood or coffee ground material  °New, significant abdominal pain  °New, significant chest pain or pain under the shoulder blades  °Painful or persistently difficult swallowing  °New shortness of breath  °Black, tarry-looking or red, bloody stools ° °FOLLOW UP:  °If any biopsies were taken you will be contacted by phone or by letter within the next 1-3 weeks. Call [phone]  if you have not heard about the biopsies in 3 weeks.  °Please also call with any specific questions about appointments or follow up tests. ° °

## 2022-10-07 LAB — SURGICAL PATHOLOGY

## 2022-10-09 ENCOUNTER — Encounter (HOSPITAL_COMMUNITY): Payer: Self-pay | Admitting: Internal Medicine

## 2022-11-15 ENCOUNTER — Other Ambulatory Visit: Payer: Self-pay | Admitting: Family Medicine

## 2022-11-16 ENCOUNTER — Other Ambulatory Visit: Payer: Self-pay

## 2022-11-16 DIAGNOSIS — E039 Hypothyroidism, unspecified: Secondary | ICD-10-CM

## 2022-11-16 MED ORDER — EUTHYROX 50 MCG PO TABS: 50.0000 ug | ORAL_TABLET | Freq: Every morning | ORAL | 2 refills | Status: DC

## 2022-12-29 ENCOUNTER — Encounter: Payer: Self-pay | Admitting: Family Medicine

## 2022-12-29 ENCOUNTER — Ambulatory Visit (INDEPENDENT_AMBULATORY_CARE_PROVIDER_SITE_OTHER): Payer: PRIVATE HEALTH INSURANCE | Admitting: Family Medicine

## 2022-12-29 VITALS — BP 123/83 | HR 64 | Ht 72.0 in | Wt 279.1 lb

## 2022-12-29 DIAGNOSIS — F1090 Alcohol use, unspecified, uncomplicated: Secondary | ICD-10-CM

## 2022-12-29 DIAGNOSIS — F411 Generalized anxiety disorder: Secondary | ICD-10-CM

## 2022-12-29 MED ORDER — ESCITALOPRAM OXALATE 20 MG PO TABS: 20.0000 mg | ORAL_TABLET | Freq: Every day | ORAL | 2 refills | Status: DC

## 2022-12-29 NOTE — Assessment & Plan Note (Signed)
Continue Lexapro at current strength.  Prescription renewed today.

## 2022-12-29 NOTE — Assessment & Plan Note (Signed)
Brief relapse over the holidays.  Doing much better at this time.  Recommend continuation of naltrexone at current strength.

## 2022-12-29 NOTE — Progress Notes (Signed)
Tanner Carroll - 47 y.o. male MRN [id number]  Date of birth: 1976-09-23  Subjective Chief Complaint  Patient presents with   Follow-up    mood    HPI Tanner Carroll is a 47 y.o. male here today for follow up visit.   He reports that he was doing pretty well.  He had a couple weeks during the holidays where he had a short relapse into alcohol use.  He has been able to cut back significantly on this since that time.  He remains on naltrexone.  He does continue to find this helpful.  Lexapro continues to work pretty well for him as well for management of his anxiety.  He does need a refill of this.  Feels pretty good with current dose of levothyroxine.  He is taking this daily.  ROS:  A comprehensive ROS was completed and negative except as noted per HPI  Allergies  Allergen Reactions   Ibuprofen Other (See Comments)    Gastric bypass surgery, cannot have   Nsaids Other (See Comments)    Gastric bypass surgery, cannot have    Past Medical History:  Diagnosis Date   Chiari malformation     Past Surgical History:  Procedure Laterality Date   ABDOMINAL SURGERY     APPENDECTOMY     COLONOSCOPY WITH PROPOFOL N/A 10/06/2022   Procedure: COLONOSCOPY WITH PROPOFOL;  Surgeon: Irene Shipper, MD;  Location: [facility];  Service: Gastroenterology;  Laterality: N/A;   COSMETIC SURGERY     body lift   GASTRIC BYPASS  2007   POLYPECTOMY  10/06/2022   Procedure: POLYPECTOMY;  Surgeon: Irene Shipper, MD;  Location: [facility];  Service: Gastroenterology;;    Social History   Socioeconomic History   Marital status: Single    Spouse name: Not on file   Number of children: Not on file   Years of education: Not on file   Highest education level: Not on file  Occupational History   Not on file  Tobacco Use   Smoking status: Never   Smokeless tobacco: Never  Substance and Sexual Activity   Alcohol use: Yes   Drug use: No   Sexual activity: Yes    Birth control/protection: Condom   Other Topics Concern   Not on file  Social History Narrative   Not on file   Social Determinants of Health   Financial Resource Strain: Not on file  Food Insecurity: Not on file  Transportation Needs: Not on file  Physical Activity: Not on file  Stress: Not on file  Social Connections: Not on file    Family History  Problem Relation Age of Onset   Cancer Mother        breast and melanoma   Colon cancer Neg Hx    Stomach cancer Neg Hx    Esophageal cancer Neg Hx     Health Maintenance  Topic Date Due   Hepatitis C Screening  07/02/2023 (Originally 01/13/1995)   HIV Screening  07/02/2023 (Originally 01/14/1992)   COVID-19 Vaccine (4 - 2023-24 season) 01/15/2024 (Originally 07/22/2022)   DTaP/Tdap/Td (2 - Td or Tdap) 09/20/2025   COLONOSCOPY (Pts 45-65yr Insurance coverage will need to be confirmed)  10/06/2032   INFLUENZA VACCINE  Completed   HPV VACCINES  Aged Out     ----------------------------------------------------------------------------------------------------------------------------------------------------------------------------------------------------------------- Physical Exam BP 123/83 (BP Location: Left Arm, Patient Position: Sitting, Cuff Size: Large)   Pulse 64   Ht 6' (1.829 m)   Wt 279 lb 1.9  oz (126.6 kg)   SpO2 98%   BMI 37.86 kg/m   Physical Exam Constitutional:      Appearance: Normal appearance.  HENT:     Head: Normocephalic and atraumatic.  Eyes:     General: No scleral icterus. Neurological:     Mental Status: He is alert.  Psychiatric:        Mood and Affect: Mood normal.        Behavior: Behavior normal.     ------------------------------------------------------------------------------------------------------------------------------------------------------------------------------------------------------------------- Assessment and Plan  Alcohol use disorder Brief relapse over the holidays.  Doing much better at this time.   Recommend continuation of naltrexone at current strength.  GAD (generalized anxiety disorder) Continue Lexapro at current strength.  Prescription renewed today.   Meds ordered this encounter  Medications   escitalopram (LEXAPRO) 20 MG tablet    Sig: Take 1 tablet (20 mg total) by mouth daily.    Dispense:  90 tablet    Refill:  2    Return in about 4 months (around 04/29/2023) for Annual exam/fasting labs.    This visit occurred during the SARS-CoV-2 public health emergency.  Safety protocols were in place, including screening questions prior to the visit, additional usage of staff PPE, and extensive cleaning of exam room while observing appropriate contact time as indicated for disinfecting solutions.

## 2023-02-20 ENCOUNTER — Other Ambulatory Visit: Payer: Self-pay | Admitting: Family Medicine

## 2023-02-23 ENCOUNTER — Encounter: Payer: Self-pay | Admitting: Family Medicine

## 2023-02-23 DIAGNOSIS — F1090 Alcohol use, unspecified, uncomplicated: Secondary | ICD-10-CM

## 2023-02-24 MED ORDER — NALTREXONE HCL 50 MG PO TABS: 50.0000 mg | ORAL_TABLET | Freq: Every day | ORAL | 1 refills | Status: DC

## 2023-02-24 NOTE — Telephone Encounter (Signed)
Requesting rx rf of Naltrexone to a local pharmacy- [facility] Last written 07/29/2022, last OV 12/29/2022, next upcoming appt 05/01/2023

## 2023-03-15 ENCOUNTER — Other Ambulatory Visit: Payer: Self-pay

## 2023-03-15 MED ORDER — ESCITALOPRAM OXALATE 20 MG PO TABS: 20.0000 mg | ORAL_TABLET | Freq: Every day | ORAL | 1 refills | Status: DC

## 2023-05-01 ENCOUNTER — Ambulatory Visit: Payer: PRIVATE HEALTH INSURANCE | Admitting: Family Medicine

## 2023-05-31 ENCOUNTER — Ambulatory Visit (INDEPENDENT_AMBULATORY_CARE_PROVIDER_SITE_OTHER): Payer: PRIVATE HEALTH INSURANCE | Admitting: Family Medicine

## 2023-05-31 ENCOUNTER — Encounter: Payer: Self-pay | Admitting: Family Medicine

## 2023-05-31 VITALS — BP 129/87 | HR 75 | Ht 72.0 in | Wt 300.0 lb

## 2023-05-31 DIAGNOSIS — Z0000 Encounter for general adult medical examination without abnormal findings: Secondary | ICD-10-CM | POA: Diagnosis not present

## 2023-05-31 DIAGNOSIS — Z13220 Encounter for screening for lipoid disorders: Secondary | ICD-10-CM

## 2023-05-31 DIAGNOSIS — E039 Hypothyroidism, unspecified: Secondary | ICD-10-CM | POA: Diagnosis not present

## 2023-05-31 NOTE — Assessment & Plan Note (Signed)
Well adult Orders Placed This Encounter  Procedures   CBC with Differential   CMP14+EGFR   Lipid Panel With LDL/HDL Ratio   TSH  Screenings: Per lab orders Immunizations: Up-to-date Anticipatory guidance/risk factor reduction: Encourage increase activity.  Recommendations per AVS

## 2023-05-31 NOTE — Patient Instructions (Signed)

## 2023-05-31 NOTE — Progress Notes (Signed)
DONTAVIAN Carroll - 47 y.o. male MRN [id number]  Date of birth: July 27, 1976  Subjective Chief Complaint  Patient presents with   Annual Exam    HPI Tanner Carroll is a 47 y.o. male here today for annual exam.   He reports that he is doing pretty well.   Some increased depressive symptoms.  He is moving to [facility] to be closer to family. He doesn't think that he wants to make any adjustments to medication at this time.   He has not been very active due to the heat.  He feels that diet could be better  He is a non-smoker.  He has done well abstaining from alcohol. Review of Systems  Constitutional:  Negative for chills, fever, malaise/fatigue and weight loss.  HENT:  Negative for congestion, ear pain and sore throat.   Eyes:  Negative for blurred vision, double vision and pain.  Respiratory:  Negative for cough and shortness of breath.   Cardiovascular:  Negative for chest pain and palpitations.  Gastrointestinal:  Negative for abdominal pain, blood in stool, constipation, heartburn and nausea.  Genitourinary:  Negative for dysuria and urgency.  Musculoskeletal:  Negative for joint pain and myalgias.  Neurological:  Negative for dizziness and headaches.  Endo/Heme/Allergies:  Does not bruise/bleed easily.  Psychiatric/Behavioral:  Negative for depression. The patient is not nervous/anxious and does not have insomnia.        Allergies  Allergen Reactions   Ibuprofen Other (See Comments)    Gastric bypass surgery, cannot have   Nsaids Other (See Comments)    Gastric bypass surgery, cannot have    Past Medical History:  Diagnosis Date   Chiari malformation     Past Surgical History:  Procedure Laterality Date   ABDOMINAL SURGERY     APPENDECTOMY     COLONOSCOPY WITH PROPOFOL N/A 10/06/2022   Procedure: COLONOSCOPY WITH PROPOFOL;  Surgeon: Hilarie Fredrickson, MD;  Location: [facility];  Service: Gastroenterology;  Laterality: N/A;   COSMETIC SURGERY     body lift   GASTRIC  BYPASS  2007   POLYPECTOMY  10/06/2022   Procedure: POLYPECTOMY;  Surgeon: Hilarie Fredrickson, MD;  Location: [facility];  Service: Gastroenterology;;    Social History   Socioeconomic History   Marital status: Single    Spouse name: Not on file   Number of children: Not on file   Years of education: Not on file   Highest education level: Bachelor's degree (e.g., BA, AB, BS)  Occupational History   Not on file  Tobacco Use   Smoking status: Never   Smokeless tobacco: Never  Substance and Sexual Activity   Alcohol use: Yes   Drug use: No   Sexual activity: Yes    Birth control/protection: Condom  Other Topics Concern   Not on file  Social History Narrative   Not on file   Social Determinants of Health   Financial Resource Strain: Low Risk  (05/27/2023)   Overall Financial Resource Strain (CARDIA)    Difficulty of Paying Living Expenses: Not hard at all  Food Insecurity: No Food Insecurity (05/27/2023)   Hunger Vital Sign    Worried About Running Out of Food in the Last Year: Never true    Ran Out of Food in the Last Year: Never true  Transportation Needs: No Transportation Needs (05/27/2023)   PRAPARE - Administrator, Civil Service (Medical): No    Lack of Transportation (Non-Medical): No  Physical Activity: Unknown (  05/27/2023)   Exercise Vital Sign    Days of Exercise per Week: 2 days    Minutes of Exercise per Session: Patient declined  Stress: Stress Concern Present (05/27/2023)   [employer] of Occupational Health - Occupational Stress Questionnaire    Feeling of Stress : To some extent  Social Connections: Socially Isolated (05/27/2023)   Social Connection and Isolation Panel [NHANES]    Frequency of Communication with Friends and Family: Twice a week    Frequency of Social Gatherings with Friends and Family: Once a week    Attends Religious Services: Never    Database administrator or Organizations: No    Attends Engineer, structural: Not on  file    Marital Status: Never married    Family History  Problem Relation Age of Onset   Cancer Mother        breast and melanoma   Colon cancer Neg Hx    Stomach cancer Neg Hx    Esophageal cancer Neg Hx     Health Maintenance  Topic Date Due   Hepatitis C Screening  07/02/2023 (Originally 01/13/1995)   HIV Screening  07/02/2023 (Originally 01/14/1992)   COVID-19 Vaccine (4 - 2023-24 season) 01/15/2024 (Originally 07/22/2022)   INFLUENZA VACCINE  06/22/2023   DTaP/Tdap/Td (2 - Td or Tdap) 09/20/2025   Colonoscopy  10/06/2032   HPV VACCINES  Aged Out     ----------------------------------------------------------------------------------------------------------------------------------------------------------------------------------------------------------------- Physical Exam BP 129/87 (BP Location: Left Arm, Patient Position: Sitting, Cuff Size: Large)   Pulse 75   Ht 6' (1.829 m)   Wt 300 lb (136.1 kg)   SpO2 97%   BMI 40.69 kg/m   Physical Exam Constitutional:      General: He is not in acute distress. HENT:     Head: Normocephalic and atraumatic.     Right Ear: Tympanic membrane and external ear normal.     Left Ear: Tympanic membrane and external ear normal.  Eyes:     General: No scleral icterus. Neck:     Thyroid: No thyromegaly.  Cardiovascular:     Rate and Rhythm: Normal rate and regular rhythm.     Heart sounds: Normal heart sounds.  Pulmonary:     Effort: Pulmonary effort is normal.     Breath sounds: Normal breath sounds.  Abdominal:     General: Bowel sounds are normal. There is no distension.     Palpations: Abdomen is soft.     Tenderness: There is no abdominal tenderness. There is no guarding.  Musculoskeletal:     Cervical back: Normal range of motion.  Lymphadenopathy:     Cervical: No cervical adenopathy.  Skin:    General: Skin is warm and dry.     Findings: No rash.  Neurological:     Mental Status: He is alert and oriented to person,  place, and time.     Cranial Nerves: No cranial nerve deficit.     Motor: No abnormal muscle tone.  Psychiatric:        Mood and Affect: Mood normal.        Behavior: Behavior normal.     ------------------------------------------------------------------------------------------------------------------------------------------------------------------------------------------------------------------- Assessment and Plan  Well adult exam Well adult Orders Placed This Encounter  Procedures   CBC with Differential   CMP14+EGFR   Lipid Panel With LDL/HDL Ratio   TSH  Screenings: Per lab orders Immunizations: Up-to-date Anticipatory guidance/risk factor reduction: Encourage increase activity.  Recommendations per AVS   No orders of the defined types were  placed in this encounter.   Return in about 3 months (around 08/31/2023) for Mood.    This visit occurred during the SARS-CoV-2 public health emergency.  Safety protocols were in place, including screening questions prior to the visit, additional usage of staff PPE, and extensive cleaning of exam room while observing appropriate contact time as indicated for disinfecting solutions.

## 2023-06-14 LAB — TSH: TSH: 2.97 u[IU]/mL (ref 0.450–4.500)

## 2023-06-14 LAB — CMP14+EGFR
ALT: 14 IU/L (ref 0–44)
AST: 20 IU/L (ref 0–40)
Albumin: 4.5 g/dL (ref 4.1–5.1)
Alkaline Phosphatase: 71 IU/L (ref 44–121)
BUN/Creatinine Ratio: 8 — ABNORMAL LOW (ref 9–20)
BUN: 7 mg/dL (ref 6–24)
Bilirubin Total: 0.7 mg/dL (ref 0.0–1.2)
CO2: 23 mmol/L (ref 20–29)
Calcium: 9.3 mg/dL (ref 8.7–10.2)
Chloride: 98 mmol/L (ref 96–106)
Creatinine, Ser: 0.84 mg/dL (ref 0.76–1.27)
Globulin, Total: 2.3 g/dL (ref 1.5–4.5)
Glucose: 91 mg/dL (ref 70–99)
Potassium: 4.8 mmol/L (ref 3.5–5.2)
Sodium: 140 mmol/L (ref 134–144)
Total Protein: 6.8 g/dL (ref 6.0–8.5)
eGFR: 109 mL/min/{1.73_m2} (ref >59)

## 2023-06-14 LAB — CBC WITH DIFFERENTIAL/PLATELET
Basophils Absolute: 0.1 10*3/uL (ref 0.0–0.2)
Basos: 1 %
EOS (ABSOLUTE): 0.2 10*3/uL (ref 0.0–0.4)
Eos: 4 %
Hematocrit: 50.9 % (ref 37.5–51.0)
Hemoglobin: 17.1 g/dL (ref 13.0–17.7)
Immature Grans (Abs): 0 10*3/uL (ref 0.0–0.1)
Immature Granulocytes: 1 %
Lymphocytes Absolute: 1.9 10*3/uL (ref 0.7–3.1)
Lymphs: 33 %
MCH: 30.5 pg (ref 26.6–33.0)
MCHC: 33.6 g/dL (ref 31.5–35.7)
MCV: 91 fL (ref 79–97)
Monocytes Absolute: 0.5 10*3/uL (ref 0.1–0.9)
Monocytes: 10 %
Neutrophils Absolute: 2.9 10*3/uL (ref 1.4–7.0)
Neutrophils: 51 %
Platelets: 247 10*3/uL (ref 150–450)
RBC: 5.6 x10E6/uL (ref 4.14–5.80)
RDW: 12.4 % (ref 11.6–15.4)
WBC: 5.7 10*3/uL (ref 3.4–10.8)

## 2023-06-14 LAB — LIPID PANEL WITH LDL/HDL RATIO
Cholesterol, Total: 179 mg/dL (ref 100–199)
HDL: 81 mg/dL (ref >39)
LDL Chol Calc (NIH): 86 mg/dL (ref 0–99)
LDL/HDL Ratio: 1.1 ratio (ref 0.0–3.6)
Triglycerides: 65 mg/dL (ref 0–149)
VLDL Cholesterol Cal: 12 mg/dL (ref 5–40)

## 2023-08-31 ENCOUNTER — Encounter: Payer: Self-pay | Admitting: Family Medicine

## 2023-08-31 ENCOUNTER — Ambulatory Visit (INDEPENDENT_AMBULATORY_CARE_PROVIDER_SITE_OTHER): Payer: PRIVATE HEALTH INSURANCE | Admitting: Family Medicine

## 2023-08-31 VITALS — BP 118/79 | HR 66 | Ht 72.0 in | Wt 307.0 lb

## 2023-08-31 DIAGNOSIS — F1090 Alcohol use, unspecified, uncomplicated: Secondary | ICD-10-CM

## 2023-08-31 DIAGNOSIS — F411 Generalized anxiety disorder: Secondary | ICD-10-CM

## 2023-08-31 MED ORDER — ESCITALOPRAM OXALATE 20 MG PO TABS: 20.0000 mg | ORAL_TABLET | Freq: Every day | ORAL | 1 refills | Status: DC

## 2023-08-31 MED ORDER — NALTREXONE HCL 50 MG PO TABS: 50.0000 mg | ORAL_TABLET | Freq: Every day | ORAL | 1 refills | Status: DC

## 2023-08-31 NOTE — Assessment & Plan Note (Signed)
Continue Lexapro at current strength.  Prescription renewed today.

## 2023-08-31 NOTE — Progress Notes (Signed)
Tanner Carroll - 47 y.o. male MRN [id number]  Date of birth: [birth year]  Subjective Chief Complaint  Patient presents with   Mood    HPI Tanner Carroll is a 47 y.o. male here today for follow up visit.   He reports that he is doing ok.   Remains on lexapro 20mg  daily.  He has been able to abstain from EtOH.  Still planning on moving to [facility] area. Some stress related to upcoming move but feels that once he is settled in his new place he will be doing much better.   ROS:  A comprehensive ROS was completed and negative except as noted per HPI  Allergies  Allergen Reactions   Ibuprofen Other (See Comments)    Gastric bypass surgery, cannot have   Nsaids Other (See Comments)    Gastric bypass surgery, cannot have    Past Medical History:  Diagnosis Date   Chiari malformation     Past Surgical History:  Procedure Laterality Date   ABDOMINAL SURGERY     APPENDECTOMY     COLONOSCOPY WITH PROPOFOL N/A 10/06/2022   Procedure: COLONOSCOPY WITH PROPOFOL;  Surgeon: Hilarie Fredrickson, MD;  Location: [facility];  Service: Gastroenterology;  Laterality: N/A;   COSMETIC SURGERY     body lift   GASTRIC BYPASS  2007   POLYPECTOMY  10/06/2022   Procedure: POLYPECTOMY;  Surgeon: Hilarie Fredrickson, MD;  Location: [facility];  Service: Gastroenterology;;    Social History   Socioeconomic History   Marital status: Single    Spouse name: Not on file   Number of children: Not on file   Years of education: Not on file   Highest education level: Bachelor's degree (e.g., BA, AB, BS)  Occupational History   Not on file  Tobacco Use   Smoking status: Never   Smokeless tobacco: Never  Substance and Sexual Activity   Alcohol use: Yes   Drug use: No   Sexual activity: Yes    Birth control/protection: Condom  Other Topics Concern   Not on file  Social History Narrative   Not on file   Social Determinants of Health   Financial Resource Strain: Low Risk  (05/27/2023)   Overall Financial  Resource Strain (CARDIA)    Difficulty of Paying Living Expenses: Not hard at all  Food Insecurity: No Food Insecurity (05/27/2023)   Hunger Vital Sign    Worried About Running Out of Food in the Last Year: Never true    Ran Out of Food in the Last Year: Never true  Transportation Needs: No Transportation Needs (05/27/2023)   PRAPARE - Administrator, Civil Service (Medical): No    Lack of Transportation (Non-Medical): No  Physical Activity: Unknown (05/27/2023)   Exercise Vital Sign    Days of Exercise per Week: 2 days    Minutes of Exercise per Session: Patient declined  Stress: Stress Concern Present (05/27/2023)   [employer] of Occupational Health - Occupational Stress Questionnaire    Feeling of Stress : To some extent  Social Connections: Socially Isolated (05/27/2023)   Social Connection and Isolation Panel [NHANES]    Frequency of Communication with Friends and Family: Twice a week    Frequency of Social Gatherings with Friends and Family: Once a week    Attends Religious Services: Never    Database administrator or Organizations: No    Attends Engineer, structural: Not on file    Marital Status: Never  married    Family History  Problem Relation Age of Onset   Cancer Mother        breast and melanoma   Colon cancer Neg Hx    Stomach cancer Neg Hx    Esophageal cancer Neg Hx     Health Maintenance  Topic Date Due   Hepatitis C Screening  08/30/2024 (Originally 01/13/1995)   HIV Screening  08/30/2024 (Originally 01/14/1992)   COVID-19 Vaccine (5 - 2023-24 season) 10/07/2023   DTaP/Tdap/Td (2 - Td or Tdap) 09/20/2025   Colonoscopy  10/06/2032   INFLUENZA VACCINE  Completed   HPV VACCINES  Aged Out     ----------------------------------------------------------------------------------------------------------------------------------------------------------------------------------------------------------------- Physical Exam BP 118/79 (BP Location:  Left Arm, Patient Position: Sitting, Cuff Size: Large)   Pulse 66   Ht 6' (1.829 m)   Wt (!) 307 lb (139.3 kg)   SpO2 97%   BMI 41.64 kg/m   Physical Exam Constitutional:      Appearance: Normal appearance.  Eyes:     General: No scleral icterus. Neurological:     Mental Status: He is alert.  Psychiatric:        Mood and Affect: Mood normal.        Behavior: Behavior normal.     ------------------------------------------------------------------------------------------------------------------------------------------------------------------------------------------------------------------- Assessment and Plan  GAD (generalized anxiety disorder) Continue Lexapro at current strength.  Prescription renewed today.  Alcohol use disorder   Doing well at this time.  Recommend continuation of naltrexone at current strength.   Meds ordered this encounter  Medications   escitalopram (LEXAPRO) 20 MG tablet    Sig: Take 1 tablet (20 mg total) by mouth daily.    Dispense:  90 tablet    Refill:  1   naltrexone (DEPADE) 50 MG tablet    Sig: Take 1 tablet (50 mg total) by mouth daily.    Dispense:  90 tablet    Refill:  1    No follow-ups on file.    This visit occurred during the SARS-CoV-2 public health emergency.  Safety protocols were in place, including screening questions prior to the visit, additional usage of staff PPE, and extensive cleaning of exam room while observing appropriate contact time as indicated for disinfecting solutions.

## 2023-08-31 NOTE — Assessment & Plan Note (Signed)
Doing well at this time.  Recommend continuation of naltrexone at current strength.

## 2024-01-01 ENCOUNTER — Encounter: Payer: Self-pay | Admitting: Family Medicine

## 2024-01-01 ENCOUNTER — Telehealth (INDEPENDENT_AMBULATORY_CARE_PROVIDER_SITE_OTHER): Payer: PRIVATE HEALTH INSURANCE | Admitting: Family Medicine

## 2024-01-01 VITALS — Ht 72.0 in | Wt 320.0 lb

## 2024-01-01 DIAGNOSIS — F1090 Alcohol use, unspecified, uncomplicated: Secondary | ICD-10-CM | POA: Diagnosis not present

## 2024-01-01 DIAGNOSIS — F411 Generalized anxiety disorder: Secondary | ICD-10-CM

## 2024-01-01 DIAGNOSIS — E039 Hypothyroidism, unspecified: Secondary | ICD-10-CM

## 2024-01-01 MED ORDER — NALTREXONE HCL 50 MG PO TABS: 50.0000 mg | ORAL_TABLET | Freq: Every day | ORAL | 1 refills | Status: DC

## 2024-01-01 MED ORDER — ESCITALOPRAM OXALATE 10 MG PO TABS
ORAL_TABLET | ORAL | 0 refills | Status: DC
Start: 2024-01-01 — End: 2024-05-02

## 2024-01-01 NOTE — Assessment & Plan Note (Signed)
 Brief relapse but is doing much better.  Continue naltrexone .

## 2024-01-01 NOTE — Progress Notes (Signed)
 Tanner Carroll - 48 y.o. male MRN [id number]  Date of birth: 1976-04-07   This visit type was conducted due to national recommendations for restrictions regarding the COVID-19 Pandemic (e.g. social distancing).  This format is felt to be most appropriate for this patient at this time.  All issues noted in this document were discussed and addressed.  No physical exam was performed (except for noted visual exam findings with Video Visits).  I discussed the limitations of evaluation and management by telemedicine and the availability of in person appointments. The patient expressed understanding and agreed to proceed.  I connected withNAME@ on 01/01/24 at 11:30 AM EST by a video enabled telemedicine application and verified that I am speaking with the correct person using two identifiers.  Present at visit: Adela Holter, DO Layman Pries   Patient Location: Home [address] [place] [address]   Provider location:   [facility]  Chief Complaint  Patient presents with   Mood    HPI  Tanner Carroll is a 48 y.o. male who presents via audio/video conferencing for a telehealth visit today.  He reports that he is doing okay.Aaron Aas  He continues on lexapro  20mg  daily.  He has moved to [facility] and is closer to family there.  He feels that this has been a positive move for him.  He did have a brief relapse in drinking but is doing better at this time.  He had stopped Lexapro  but would like to restart this.  He continues on naltrexone .   ROS:  A comprehensive ROS was completed and negative except as noted per HPI  Past Medical History:  Diagnosis Date   Chiari malformation     Past Surgical History:  Procedure Laterality Date   ABDOMINAL SURGERY     APPENDECTOMY     COLONOSCOPY WITH PROPOFOL  N/A 10/06/2022   Procedure: COLONOSCOPY WITH PROPOFOL ;  Surgeon: Tobin Forts, MD;  Location: [facility];  Service: Gastroenterology;  Laterality: N/A;   COSMETIC SURGERY     body lift    GASTRIC BYPASS  2007   POLYPECTOMY  10/06/2022   Procedure: POLYPECTOMY;  Surgeon: Tobin Forts, MD;  Location: [facility];  Service: Gastroenterology;;    Family History  Problem Relation Age of Onset   Cancer Mother        breast and melanoma   Colon cancer Neg Hx    Stomach cancer Neg Hx    Esophageal cancer Neg Hx     Social History   Socioeconomic History   Marital status: Single    Spouse name: Not on file   Number of children: Not on file   Years of education: Not on file   Highest education level: Bachelor's degree (e.g., BA, AB, BS)  Occupational History   Not on file  Tobacco Use   Smoking status: Never   Smokeless tobacco: Never  Substance and Sexual Activity   Alcohol use: Yes   Drug use: No   Sexual activity: Yes    Birth control/protection: Condom  Other Topics Concern   Not on file  Social History Narrative   Not on file   Social Drivers of Health   Financial Resource Strain: Low Risk  (05/27/2023)   Overall Financial Resource Strain (CARDIA)    Difficulty of Paying Living Expenses: Not hard at all  Food Insecurity: No Food Insecurity (05/27/2023)   Hunger Vital Sign    Worried About Running Out of Food in the Last  Year: Never true    Ran Out of Food in the Last Year: Never true  Transportation Needs: No Transportation Needs (05/27/2023)   PRAPARE - Administrator, Civil Service (Medical): No    Lack of Transportation (Non-Medical): No  Physical Activity: Unknown (05/27/2023)   Exercise Vital Sign    Days of Exercise per Week: 2 days    Minutes of Exercise per Session: Patient declined  Stress: Stress Concern Present (05/27/2023)   [employer] of Occupational Health - Occupational Stress Questionnaire    Feeling of Stress : To some extent  Social Connections: Socially Isolated (05/27/2023)   Social Connection and Isolation Panel [NHANES]    Frequency of Communication with Friends and Family: Twice a week    Frequency of Social  Gatherings with Friends and Family: Once a week    Attends Religious Services: Never    Database administrator or Organizations: No    Attends Engineer, structural: Not on file    Marital Status: Never married  Intimate Partner Violence: Unknown (02/23/2022)   Received from [employer]   HITS    Physically Hurt: Not on file    Insult or Talk Down To: Not on file    Threaten Physical Harm: Not on file    Scream or Curse: Not on file     Current Outpatient Medications:    Cholecalciferol (VITAMIN D -3 PO), Take 6,000 Units by mouth daily., Disp: , Rfl:    escitalopram  (LEXAPRO ) 10 MG tablet, Take 1/2 tab x7 days then increase to full tab., Disp: 90 tablet, Rfl: 0   EUTHYROX  50 MCG tablet, Take 1 tablet (50 mcg total) by mouth every morning., Disp: 90 tablet, Rfl: 2   Milk Thistle 1000 MG CAPS, Take 1,000 mg by mouth daily., Disp: , Rfl:    NON FORMULARY, Take 1 capsule by mouth daily. Bariatric Choice w/ 45mg  of iron, Disp: , Rfl:    naltrexone  (DEPADE) 50 MG tablet, Take 1 tablet (50 mg total) by mouth daily., Disp: 90 tablet, Rfl: 1  EXAM:  VITALS per patient if applicable: Ht 6' (1.829 m)   Wt (!) 320 lb (145.2 kg)   BMI 43.40 kg/m   GENERAL: alert, oriented, appears well and in no acute distress  HEENT: atraumatic, conjunttiva clear, no obvious abnormalities on inspection of external nose and ears  NECK: normal movements of the head and neck  LUNGS: on inspection no signs of respiratory distress, breathing rate appears normal, no obvious gross SOB, gasping or wheezing  CV: no obvious cyanosis  MS: moves all visible extremities without noticeable abnormality  PSYCH/NEURO: pleasant and cooperative, no obvious depression or anxiety, speech and thought processing grossly intact  ASSESSMENT AND PLAN:  Discussed the following assessment and plan:  Alcohol use disorder Brief relapse but is doing much better.  Continue naltrexone .  GAD (generalized  anxiety disorder) Restarting Lexapro .  Will start at 5 mg and titrate up as tolerated.     I discussed the assessment and treatment plan with the patient. The patient was provided an opportunity to ask questions and all were answered. The patient agreed with the plan and demonstrated an understanding of the instructions.   The patient was advised to call back or seek an in-person evaluation if the symptoms worsen or if the condition fails to improve as anticipated.    Adela Holter, DO

## 2024-01-01 NOTE — Assessment & Plan Note (Signed)
 Restarting Lexapro .  Will start at 5 mg and titrate up as tolerated.

## 2024-01-02 MED ORDER — EUTHYROX 50 MCG PO TABS: 50.0000 ug | ORAL_TABLET | Freq: Every morning | ORAL | 2 refills | Status: DC

## 2024-01-08 ENCOUNTER — Encounter: Payer: Self-pay | Admitting: Family Medicine

## 2024-04-10 ENCOUNTER — Other Ambulatory Visit: Payer: Self-pay | Admitting: Medical Genetics

## 2024-04-16 ENCOUNTER — Encounter: Payer: Self-pay | Admitting: Family Medicine

## 2024-05-02 ENCOUNTER — Encounter: Payer: Self-pay | Admitting: Family Medicine

## 2024-05-02 ENCOUNTER — Ambulatory Visit (INDEPENDENT_AMBULATORY_CARE_PROVIDER_SITE_OTHER): Payer: PRIVATE HEALTH INSURANCE | Admitting: Family Medicine

## 2024-05-02 VITALS — BP 115/81 | HR 55 | Ht 72.0 in | Wt 313.0 lb

## 2024-05-02 DIAGNOSIS — Z1509 Genetic susceptibility to other malignant neoplasm: Secondary | ICD-10-CM

## 2024-05-02 DIAGNOSIS — E039 Hypothyroidism, unspecified: Secondary | ICD-10-CM

## 2024-05-02 DIAGNOSIS — Z1501 Genetic susceptibility to malignant neoplasm of breast: Secondary | ICD-10-CM

## 2024-05-02 DIAGNOSIS — Z0000 Encounter for general adult medical examination without abnormal findings: Secondary | ICD-10-CM

## 2024-05-02 DIAGNOSIS — Z808 Family history of malignant neoplasm of other organs or systems: Secondary | ICD-10-CM

## 2024-05-02 DIAGNOSIS — Z13220 Encounter for screening for lipoid disorders: Secondary | ICD-10-CM | POA: Diagnosis not present

## 2024-05-02 MED ORDER — ESCITALOPRAM OXALATE 20 MG PO TABS: ORAL_TABLET | ORAL | 1 refills | Status: DC

## 2024-05-02 NOTE — Patient Instructions (Signed)

## 2024-05-03 LAB — CBC WITH DIFFERENTIAL/PLATELET
Basophils Absolute: 0.1 10*3/uL (ref 0.0–0.2)
Basos: 1 %
EOS (ABSOLUTE): 0.2 10*3/uL (ref 0.0–0.4)
Eos: 3 %
Hematocrit: 48.7 % (ref 37.5–51.0)
Hemoglobin: 16 g/dL (ref 13.0–17.7)
Immature Grans (Abs): 0 10*3/uL (ref 0.0–0.1)
Immature Granulocytes: 0 %
Lymphocytes Absolute: 2.9 10*3/uL (ref 0.7–3.1)
Lymphs: 43 %
MCH: 30.5 pg (ref 26.6–33.0)
MCHC: 32.9 g/dL (ref 31.5–35.7)
MCV: 93 fL (ref 79–97)
Monocytes Absolute: 0.5 10*3/uL (ref 0.1–0.9)
Monocytes: 7 %
Neutrophils Absolute: 3.1 10*3/uL (ref 1.4–7.0)
Neutrophils: 46 %
Platelets: 232 10*3/uL (ref 150–450)
RBC: 5.25 x10E6/uL (ref 4.14–5.80)
RDW: 13.1 % (ref 11.6–15.4)
WBC: 6.8 10*3/uL (ref 3.4–10.8)

## 2024-05-03 LAB — CMP14+EGFR
ALT: 19 IU/L (ref 0–44)
AST: 18 IU/L (ref 0–40)
Albumin: 4.4 g/dL (ref 4.1–5.1)
Alkaline Phosphatase: 97 IU/L (ref 44–121)
BUN/Creatinine Ratio: 9 (ref 9–20)
BUN: 8 mg/dL (ref 6–24)
Bilirubin Total: 0.7 mg/dL (ref 0.0–1.2)
CO2: 25 mmol/L (ref 20–29)
Calcium: 9.1 mg/dL (ref 8.7–10.2)
Chloride: 101 mmol/L (ref 96–106)
Creatinine, Ser: 0.92 mg/dL (ref 0.76–1.27)
Globulin, Total: 2.4 g/dL (ref 1.5–4.5)
Glucose: 81 mg/dL (ref 70–99)
Potassium: 4.6 mmol/L (ref 3.5–5.2)
Sodium: 140 mmol/L (ref 134–144)
Total Protein: 6.8 g/dL (ref 6.0–8.5)
eGFR: 103 mL/min/{1.73_m2} (ref >59)

## 2024-05-03 LAB — LIPID PANEL WITH LDL/HDL RATIO
Cholesterol, Total: 192 mg/dL (ref 100–199)
HDL: 61 mg/dL (ref >39)
LDL Chol Calc (NIH): 118 mg/dL — ABNORMAL HIGH (ref 0–99)
LDL/HDL Ratio: 1.9 ratio (ref 0.0–3.6)
Triglycerides: 73 mg/dL (ref 0–149)
VLDL Cholesterol Cal: 13 mg/dL (ref 5–40)

## 2024-05-03 LAB — TSH: TSH: 2.25 u[IU]/mL (ref 0.450–4.500)

## 2024-05-03 LAB — SPECIMEN STATUS REPORT

## 2024-05-05 ENCOUNTER — Encounter: Payer: Self-pay | Admitting: Family Medicine

## 2024-05-05 NOTE — Assessment & Plan Note (Signed)
 Well adult Orders Placed This Encounter  Procedures   CMP14+EGFR   CBC with Differential/Platelet   Lipid Panel With LDL/HDL Ratio   TSH   Specimen status report   Ambulatory referral to Genetics    Referral Priority:   Routine    Referral Type:   Consultation    Referral Reason:   Specialty Services Required    Number of Visits Requested:   1   Ambulatory referral to Dermatology    Referral Priority:   Routine    Referral Type:   Consultation    Referral Reason:   Specialty Services Required    Requested Specialty:   Dermatology    Number of Visits Requested:   1  Screenings: Per lab orders Immunizations: Up-to-date Anticipatory guidance/risk factor reduction: Encourage increase activity.  Recommendations per AVS

## 2024-05-05 NOTE — Progress Notes (Signed)
 Tanner Carroll - 48 y.o. male MRN [id number]  Date of birth: May 05, 1976  Subjective Chief Complaint  Patient presents with   Annual Exam    Annual exam non fasting  labs [id number]    HPI Tanner Carroll is a 48 year old male here today for annual exam.  Reports family history of BRCA2 gene and like to have further genetic counseling for this.  He did increase his Lexapro  back to 20 mg as well and needs updated prescription.  His mother did have melanoma and he would like to have referral to dermatology for routine skin checks.  He has cut back on alcohol use.  He is a non-smoker.  Review of Systems  Constitutional:  Negative for chills, fever, malaise/fatigue and weight loss.  HENT:  Negative for congestion, ear pain and sore throat.   Eyes:  Negative for blurred vision, double vision and pain.  Respiratory:  Negative for cough and shortness of breath.   Cardiovascular:  Negative for chest pain and palpitations.  Gastrointestinal:  Negative for abdominal pain, blood in stool, constipation, heartburn and nausea.  Genitourinary:  Negative for dysuria and urgency.  Musculoskeletal:  Negative for joint pain and myalgias.  Neurological:  Negative for dizziness and headaches.  Endo/Heme/Allergies:  Does not bruise/bleed easily.  Psychiatric/Behavioral:  Negative for depression. The patient is not nervous/anxious and does not have insomnia.     Allergies  Allergen Reactions   Ibuprofen Other (See Comments)    Gastric bypass surgery, cannot have   Nsaids Other (See Comments)    Gastric bypass surgery, cannot have    Past Medical History:  Diagnosis Date   Chiari malformation     Past Surgical History:  Procedure Laterality Date   ABDOMINAL SURGERY     APPENDECTOMY     COLONOSCOPY WITH PROPOFOL  N/A 10/06/2022   Procedure: COLONOSCOPY WITH PROPOFOL ;  Surgeon: Tobin Forts, MD;  Location: [facility];  Service: Gastroenterology;  Laterality: N/A;   COSMETIC SURGERY     body  lift   GASTRIC BYPASS  2007   POLYPECTOMY  10/06/2022   Procedure: POLYPECTOMY;  Surgeon: Tobin Forts, MD;  Location: [facility];  Service: Gastroenterology;;    Social History   Socioeconomic History   Marital status: Single    Spouse name: Not on file   Number of children: Not on file   Years of education: Not on file   Highest education level: Bachelor's degree (e.g., BA, AB, BS)  Occupational History   Not on file  Tobacco Use   Smoking status: Never   Smokeless tobacco: Never  Substance and Sexual Activity   Alcohol use: Yes   Drug use: No   Sexual activity: Yes    Birth control/protection: Condom  Other Topics Concern   Not on file  Social History Narrative   Not on file   Social Drivers of Health   Financial Resource Strain: Low Risk  (05/01/2024)   Overall Financial Resource Strain (CARDIA)    Difficulty of Paying Living Expenses: Not hard at all  Food Insecurity: No Food Insecurity (05/01/2024)   Hunger Vital Sign    Worried About Running Out of Food in the Last Year: Never true    Ran Out of Food in the Last Year: Never true  Transportation Needs: No Transportation Needs (05/01/2024)   PRAPARE - Administrator, Civil Service (Medical): No    Lack of Transportation (Non-Medical): No  Physical Activity: Insufficiently Active (05/01/2024)   Exercise  Vital Sign    Days of Exercise per Week: 2 days    Minutes of Exercise per Session: 20 min  Stress: No Stress Concern Present (05/01/2024)   [employer] of Occupational Health - Occupational Stress Questionnaire    Feeling of Stress : Only a little  Social Connections: Socially Isolated (05/01/2024)   Social Connection and Isolation Panel    Frequency of Communication with Friends and Family: More than three times a week    Frequency of Social Gatherings with Friends and Family: More than three times a week    Attends Religious Services: Never    Database administrator or Organizations: No     Attends Engineer, structural: Not on file    Marital Status: Never married    Family History  Problem Relation Age of Onset   Cancer Mother        breast and melanoma   Colon cancer Neg Hx    Stomach cancer Neg Hx    Esophageal cancer Neg Hx     Health Maintenance  Topic Date Due   COVID-19 Vaccine (5 - 2024-25 season) 05/18/2024 (Originally 10/07/2023)   Hepatitis C Screening  08/30/2024 (Originally 01/13/1995)   HIV Screening  08/30/2024 (Originally 01/14/1992)   INFLUENZA VACCINE  06/21/2024   DTaP/Tdap/Td (2 - Td or Tdap) 09/20/2025   Colonoscopy  10/06/2032   Pneumococcal Vaccine 73-106 Years old  Aged Out   HPV VACCINES  Aged Out   Meningococcal B Vaccine  Aged Out     ----------------------------------------------------------------------------------------------------------------------------------------------------------------------------------------------------------------- Physical Exam BP 115/81   Pulse (!) 55   Ht 6' (1.829 m)   Wt (!) 313 lb (142 kg)   SpO2 99%   BMI 42.45 kg/m   Physical Exam Constitutional:      General: He is not in acute distress. HENT:     Head: Normocephalic and atraumatic.     Right Ear: Tympanic membrane and external ear normal.     Left Ear: Tympanic membrane and external ear normal.   Eyes:     General: No scleral icterus.  Neck:     Thyroid : No thyromegaly.   Cardiovascular:     Rate and Rhythm: Normal rate and regular rhythm.     Heart sounds: Normal heart sounds.  Pulmonary:     Effort: Pulmonary effort is normal.     Breath sounds: Normal breath sounds.  Abdominal:     General: Bowel sounds are normal. There is no distension.     Palpations: Abdomen is soft.     Tenderness: There is no abdominal tenderness. There is no guarding.   Musculoskeletal:     Cervical back: Normal range of motion.  Lymphadenopathy:     Cervical: No cervical adenopathy.   Skin:    General: Skin is warm and dry.     Findings:  No rash.   Neurological:     Mental Status: He is alert and oriented to person, place, and time.     Cranial Nerves: No cranial nerve deficit.     Motor: No abnormal muscle tone.   Psychiatric:        Mood and Affect: Mood normal.        Behavior: Behavior normal.     ------------------------------------------------------------------------------------------------------------------------------------------------------------------------------------------------------------------- Assessment and Plan  Well adult exam Well adult Orders Placed This Encounter  Procedures   CMP14+EGFR   CBC with Differential/Platelet   Lipid Panel With LDL/HDL Ratio   TSH   Specimen status report   Ambulatory  referral to Genetics    Referral Priority:   Routine    Referral Type:   Consultation    Referral Reason:   Specialty Services Required    Number of Visits Requested:   1   Ambulatory referral to Dermatology    Referral Priority:   Routine    Referral Type:   Consultation    Referral Reason:   Specialty Services Required    Requested Specialty:   Dermatology    Number of Visits Requested:   1  Screenings: Per lab orders Immunizations: Up-to-date Anticipatory guidance/risk factor reduction: Encourage increase activity.  Recommendations per AVS   Meds ordered this encounter  Medications   escitalopram  (LEXAPRO ) 20 MG tablet    Sig: Take 1 tab po daily.    Dispense:  90 tablet    Refill:  1    No follow-ups on file.

## 2024-05-10 ENCOUNTER — Ambulatory Visit: Payer: Self-pay | Admitting: Family Medicine

## 2024-06-25 ENCOUNTER — Encounter: Payer: Self-pay | Admitting: Family Medicine

## 2024-07-31 ENCOUNTER — Encounter: Payer: Self-pay | Admitting: Family Medicine

## 2024-08-01 MED ORDER — TIRZEPATIDE 10 MG/0.5ML ~~LOC~~ SOAJ: SUBCUTANEOUS | 6 refills | Status: DC

## 2024-08-02 ENCOUNTER — Other Ambulatory Visit: Payer: Self-pay

## 2024-08-02 DIAGNOSIS — F1090 Alcohol use, unspecified, uncomplicated: Secondary | ICD-10-CM

## 2024-08-05 MED ORDER — NALTREXONE HCL 50 MG PO TABS: 50.0000 mg | ORAL_TABLET | Freq: Every day | ORAL | 1 refills | Status: DC

## 2024-08-30 ENCOUNTER — Other Ambulatory Visit: Payer: Self-pay | Admitting: Family Medicine

## 2024-08-30 MED ORDER — ESCITALOPRAM OXALATE 20 MG PO TABS: ORAL_TABLET | ORAL | 1 refills | Status: DC

## 2024-09-22 ENCOUNTER — Other Ambulatory Visit: Payer: Self-pay | Admitting: Medical Genetics

## 2024-09-22 DIAGNOSIS — Z006 Encounter for examination for normal comparison and control in clinical research program: Secondary | ICD-10-CM

## 2024-10-22 ENCOUNTER — Encounter: Payer: Self-pay | Admitting: Family Medicine

## 2024-10-22 DIAGNOSIS — F1090 Alcohol use, unspecified, uncomplicated: Secondary | ICD-10-CM

## 2024-10-22 DIAGNOSIS — E039 Hypothyroidism, unspecified: Secondary | ICD-10-CM

## 2024-10-22 MED ORDER — EUTHYROX 50 MCG PO TABS: 50.0000 ug | ORAL_TABLET | Freq: Every morning | ORAL | 2 refills | Status: DC

## 2024-10-23 LAB — GENECONNECT MOLECULAR SCREEN: Genetic Analysis Overall Interpretation: POSITIVE — AB

## 2024-10-23 MED ORDER — LEVOTHYROXINE SODIUM 50 MCG PO TABS: 50.0000 ug | ORAL_TABLET | Freq: Every morning | ORAL | 2 refills | Status: AC

## 2024-10-24 ENCOUNTER — Telehealth: Payer: Self-pay | Admitting: Medical Genetics

## 2024-10-24 DIAGNOSIS — Z1507 Genetic susceptibility to malignant neoplasm of urinary tract: Secondary | ICD-10-CM | POA: Insufficient documentation

## 2024-10-24 NOTE — Telephone Encounter (Signed)
 [facility] Positive Result Note 10/24/2024 4:41 PM  FIRST ATTEMPT: Confirmed I was speaking with Glendia KANDICE Ferretti [phone] by using name and DOB. Informed participant the reason for this call is to provide results for the above study. Results revealed Hereditary Breast and Ovarian Syndrome. Genetic counseling was offered and participant requests a call back to schedule. All questions were answered, and participant was thanked for their time and support of the above study. Participant was encouraged to contact [facility] if they have any further questions or concerns.

## 2024-11-20 ENCOUNTER — Telehealth: Payer: Self-pay | Admitting: Medical Genetics

## 2024-11-20 NOTE — Telephone Encounter (Signed)
 [facility] 11/20/2024 3:14 PM  Confirmed I was speaking with Tanner Carroll [phone] by using name and DOB. Genetic counseling was offered and participant is scheduled. All questions were answered, and participant was thanked for their time and support of the above study. Participant was encouraged to contact [facility] if they have any further questions or concerns.    Tanner Carroll, BS [place]  [facility] Clinical Research Specialist II Direct Dial: [phone]  Fax: [phone]

## 2024-11-25 MED ORDER — NALTREXONE HCL 50 MG PO TABS: 50.0000 mg | ORAL_TABLET | Freq: Every day | ORAL | 1 refills | Status: AC

## 2024-11-25 NOTE — Addendum Note (Signed)
 Addended by: BONNY JON DEL on: 11/25/2024 08:18 AM   Modules accepted: Orders

## 2024-12-03 ENCOUNTER — Encounter: Payer: PRIVATE HEALTH INSURANCE | Admitting: Genetic Counselor

## 2024-12-03 ENCOUNTER — Encounter: Payer: Self-pay | Admitting: Genetic Counselor

## 2024-12-03 ENCOUNTER — Telehealth: Payer: Self-pay | Admitting: Genetic Counselor

## 2024-12-03 NOTE — Telephone Encounter (Signed)
 Calling for genetic counseling appointment scheduled 12/03/24 at 9:30 am. Left voicemail asking for call back if Mr. Dunklee still interested in completing.

## 2024-12-09 MED ORDER — ESCITALOPRAM OXALATE 20 MG PO TABS: ORAL_TABLET | ORAL | 1 refills | Status: AC

## 2024-12-09 NOTE — Addendum Note (Signed)
 Addended by: BONNY JON DEL on: 12/09/2024 08:25 AM   Modules accepted: Orders

## 2024-12-12 ENCOUNTER — Ambulatory Visit: Admitting: Genetic Counselor

## 2024-12-12 ENCOUNTER — Encounter: Payer: Self-pay | Admitting: Genetic Counselor

## 2024-12-12 DIAGNOSIS — Z1501 Genetic susceptibility to malignant neoplasm of breast: Secondary | ICD-10-CM

## 2024-12-12 NOTE — Progress Notes (Signed)
 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 male, was seen for a [place] genetic counseling as follow up to his participation in the [place] Study, part of the [employer] [facility] Program. Tanner Carroll presents to clinic today to discuss the results of the genetic testing provided by the Gene[facility] study, which did identify a pathogenic variant in the BRCA2 gene.   RELEVANT MEDICAL HISTORY:  Tanner Carroll is a 49 y.o. male with no personal history of cancer.    No history of PSA screening, scheduled with urologist 12/27/24. Colonoscopy 09/2022, four polyps identified, three year follow up recommended. Full body skin exam with dermatology in 2025, plans for annual visit.   Past Medical History:  Diagnosis Date   Chiari malformation     Past Surgical History:  Procedure Laterality Date   ABDOMINAL SURGERY     APPENDECTOMY     COLONOSCOPY WITH PROPOFOL  N/A 10/06/2022   Procedure: COLONOSCOPY WITH PROPOFOL ;  Surgeon: Abran Norleen SAILOR, MD;  Location: [facility];  Service: Gastroenterology;  Laterality: N/A;   COSMETIC SURGERY     body lift   GASTRIC BYPASS  2007   POLYPECTOMY  10/06/2022   Procedure: POLYPECTOMY;  Surgeon: Abran Norleen SAILOR, MD;  Location: [facility];  Service: Gastroenterology;;    Social History   Socioeconomic History   Marital status: Single    Spouse name: Not on file   Number of children: Not on file   Years of education: Not on file   Highest education level: Bachelor's degree (e.g., BA, AB, BS)  Occupational History   Not on file  Tobacco Use   Smoking status: Never   Smokeless tobacco: Never  Substance and Sexual Activity   Alcohol use: Yes   Drug use: No   Sexual activity: Yes    Birth control/protection: Condom  Other Topics Concern   Not on file  Social History Narrative   Not on file   Social Drivers of Health   Tobacco Use: Low Risk (05/05/2024)   Patient  History    Smoking Tobacco Use: Never    Smokeless Tobacco Use: Never    Passive Exposure: Not on file  Financial Resource Strain: Low Risk (05/01/2024)   Overall Financial Resource Strain (CARDIA)    Difficulty of Paying Living Expenses: Not hard at all  Food Insecurity: No Food Insecurity (05/01/2024)   Hunger Vital Sign    Worried About Running Out of Food in the Last Year: Never true    Ran Out of Food in the Last Year: Never true  Transportation Needs: No Transportation Needs (05/01/2024)   PRAPARE - Administrator, Civil Service (Medical): No    Lack of Transportation (Non-Medical): No  Physical Activity: Insufficiently Active (05/01/2024)   Exercise Vital Sign    Days of Exercise per Week: 2 days    Minutes of Exercise per Session: 20 min  Stress: No Stress Concern Present (05/01/2024)   [employer] of Occupational Health - Occupational Stress Questionnaire    Feeling of Stress : Only a little  Social Connections: Socially Isolated (05/01/2024)   Social Connection and Isolation Panel    Frequency of Communication with Friends and Family: More than three times a week    Frequency of Social Gatherings with Friends and Family: More than three times a week    Attends Religious Services: Never    Active Member of  Clubs or Organizations: No    Attends Banker Meetings: Not on file    Marital Status: Never married  Depression (PHQ2-9): Low Risk (05/02/2024)   Depression (PHQ2-9)    PHQ-2 Score: 4  Alcohol Screen: Low Risk (05/01/2024)   Alcohol Screen    Last Alcohol Screening Score (AUDIT): 2  Housing: Low Risk (05/01/2024)   Housing Stability Vital Sign    Unable to Pay for Housing in the Last Year: No    Number of Times Moved in the Last Year: 0    Homeless in the Last Year: No  Utilities: Not on file  Health Literacy: Not on file     FAMILY HISTORY:  We obtained a detailed, 3-generation family history.  Per patient report. Significant diagnoses  are listed below: Family History  Problem Relation Age of Onset   Breast cancer Mother 61 - 63   Melanoma Mother 60 - 48       anal melanoma, mets to brain   Pancreatic cancer Maternal Grandfather        GI cancer? unsure of specifics   Lung cancer Maternal Grandmother    Kidney cancer Maternal Uncle 31 2058-01-07   Prostate cancer Maternal Uncle 65   BRCA 1/2 Maternal Uncle    BRCA 1/2 Maternal Cousin    Colon cancer Neg Hx    Stomach cancer Neg Hx    Esophageal cancer Neg Hx     Tanner Carroll is aware of previous family history of genetic testing, reporting his maternal uncle and cousin have tested positive for the same BRCA2 variant. His maternal aunt and maternal cousin have tested negative. Family member genetic test reports were not available for review by dentist. Patient's maternal ancestors are of British, Welsh, and Scottish descent, and paternal ancestors are of Norwegian and German descent. There is no reported Ashkenazi Jewish ancestry. There is no known consanguinity.  Paternal history:  Limited information  Reports that biologic father died in older age, no known history of cancer, no relationship    Maternal history:  Mother diagnosed with breast cancer in her late 73s or early 56s. Later diagnosed with an anal melanoma, metastatic. She passed away in 2021/01/07.  Maternal aunt, living, no cancer. Reports normal genetic testing  Maternal uncle, living at 69. Went through [employer] study, identified to carry a BRCA2 variant. Following identification of variant, requested PSA and was diagnosed with an aggressive prostate cancer and is currently being treated. History of incidentally identified kidney cancer over 20 years ago.  Son, no cancer, identified to carry BRCA2 variant  Daughter, no cancer, normal genetic testing  Maternal uncle, deceased, no cancer  Maternal grandfather, deceased. Believes diagnosed with pancreatic cancer, unknown age. History of heavy cigarette and  alcohol use  Maternal grandmother, deceased. History of lung cancer, unknown age. History of cigarette use.    GENETIC TEST RESULTS:  Tanner Carroll participated in the [place] [facility] population genomic screening program. A single, heterozygous pathogenic variant was detected in the BRCA2 gene called c.2830A>T (p.Lys944Ter). There were no pathogenic or likely pathogenic variants detected in other genes reported on in this study.   [facility] Population Screen is a screening test that analyzes 11 genes related to hereditary breast and ovarian cancer syndrome (HBOC), Lynch syndrome, and familial hypercholesterolemia. These include APOB, BRCA1, BRCA2, EPCAM, LDLR, LDLRAP1, PCSK9, PMS2 (excluding exons 11-15), MLH1, MSH2, MSH6. This test reports pathogenic and likely pathogenic variants, but does not report on variants of  unknown significance. The absence of any additional pathogenic variants is reassuring, but does not rule out a hereditary condition. There are other variants and genes associated with heart disease, hereditary cancer and other inherited conditions that were not included in this test. Please see full test report in labs, labeled [facility] Molecular Screen, dated 10/14/24.     CLINICAL INFORMATION:  Pathogenic variants or mutations in BRCA2 are associated with an increased risk to develop breast cancer, ovarian cancer, prostate cancer, and pancreatic cancer, and melanoma skin cancer. People with a hereditary mutation in BRCA2 have a condition called Hereditary Breast and Ovarian Cancer (HBOC) Syndrome. The cancer risks and management recommendations associated with BRCA2 mutations are listed below:  Type of Cancer BRCA2 Mutation Lifetime Risk Average Lifetime Risk  Male breast 55-69% 12%  Contralateral, male breast  25% over 20 years Less than 10% over 48 years  Male breast Up to 7.1% by age 68 0.1%  Ovarian 13-29% 1-2%  Prostate 19-61% 12%  Pancreatic 5-10% 1.6%   Melanoma Increased  1-2%   Limited data suggest there may be a slightly increased risk of serous uterine cancer in women with a BRCA mutation. Further research is needed to better understand this association.   Risk numbers are based on NCCN v2.2026. Risk estimates may evolve over time as new information is learned about BRCA2 mutations.    Management Recommendations: Per NCCN v2.2026, Genetic/Familial High Risk Assessment: Breast, Ovarian, Pancreatic, Prostate.   Breast Screening/Risk Reduction: For men (assigned male at birth):  Breast self-exam training and education starting at age 48 years Annual clinical breast exam starting at age 13 years  Consider annual mammogram starting at age 85 or 10 years before the earliest known male breast cancer in the family (whichever comes first).   Prostate Cancer Screening: Annual digital rectal exam (DRE) at age 89 Annual PSA blood test at age 74  Pancreatic Cancer Screening/Risk Reduction: Avoid smoking, heavy alcohol use, and obesity. Recommended for BRCA2 pathogenic and likely pathogenic variants. Begin at age 80 (or if there is a close family member with pancreatic cancer, 10 years before the age at which they were diagnosed) and repeat yearly.  Ideally, screening should be performed in experienced centers utilizing a multidisciplinary approach under research conditions. Recommended screening could include annual endoscopic ultrasound (preferred) and/or MRI of the pancreas. CA19-9 testing in blood may be considered based on the physician's discretion.  Skin Cancer Screening and Risk Reduction: Regular skin self-examinations Individuals should notify their physicians of any changes to moles such as increasing in size, darkening in color, or other change in appearance. Annual skin examinations by a dermatologist  Follow sun-safety recommendations such as: Using UVA and UVB 30 SPF or higher sunscreen Avoiding sunburns Limiting sun exposure,  especially during the hours of 11am-4pm  Wearing protective clothing and sunglasses Avoid using tanning beds  Additional Considerations: Recent studies have suggested PARP inhibitors may be a beneficial chemotherapeutic agent for a subset of patients with BRCA2-associated breast, ovarian, prostate, and pancreatic cancers. Clinical trials are currently in process to determine if and how these agents can be useful in the treatment of BRCA2 cancer patients.  Additional considerations for male relatives identified to carry BRCA2 variants:  Breast cancer surveillance and prevention for women (assigned male at birth):  Screening: Breast awareness starting at age 90 Clinical breast exam every 6-12 months starting at age 36, or sooner depending on family history  Annual breast MRI with contrast beginning at age 2-29 (or annual mammograms  with consideration of tomosynthesis if MRI is unavailable), although the age to initiate screening may be individualized based on family history Annual mammogram beginning at age 56 until age 22 with consideration of tomosynthesis with continuation of annual breast MRI with contrast Management should be personalized to people over the age of 48 years old  Surgery:  Prophylactic bilateral risk-reducing mastectomy (RRM), which is the removed of breast tissue before cancer develops, can reduce the risk of a breast cancer by 90% or more depending on the type of surgery. This option can be discussed with a surgeon and a plastic surgeon if breast reconstruction is completed as well.  For women with a BRCA2 pathogenic or likely pathogenic variant who are treated for breast cancer and have not had a bilateral mastectomy, screening with annual mammogram with consideration of tomosynthesis and breast MRI should continue as described above. Medication (chemoprevention): Consider breast cancer risk reducing medications   Gynecological Cancer Screening/Risk  Reduction: Non-surgical risk reduction: Consultation with a gynecologic oncologist is recommended Consideration of certain types of birth control, thorough discussion of benefits and risks. Oral contraceptive use has been shown to reduce the risk of ovarian cancer by approximately 60% in BRCA2 mutation carriers if taken for at least 5 years. This risk reduction remains even after discontinuation of oral contraceptives.  Ovarian cancer screening is an option for women who chose not to have a RRSO or who, as of yet, have not completed their family. Current screening methods for ovarian cancer are neither sensitive nor specific, meaning that often early stage ovarian cancer cannot be diagnosed through this screening.  Screening can also be falsely positive with no cancer present. For this reason, RRSO is recommended over screening. If ovarian cancer screening is recommended by your physician, it could include: CA-125 blood tests Transvaginal ultrasounds Clinical pelvic exams Surgical risk reduction:  Risk reducing salpingo-oophorectomy (RRSO, surgery to remove ovaries and fallopian tubes) is recommended beginning at age 75-45 or once childbearing is complete. Recommend that surgery is completed with a provider familiar in screening for cancers, such as a gynecologic oncologist. Removal of fallopian tubes with delayed removal of ovaries may be considered as well This surgery can reduce the risk of ovarian cancer by approximately 80% and can further reduce breast cancer risk in women who have not yet gone through menopause. There is still a small risk of developing an ovarian-like cancer in the lining of the abdomen, called the peritoneum. Some studies have found a link between BRCA mutations and serous uterine cancer. We do not have enough information to make a recommendation about whether the uterus should be removed along with the ovaries and fallopian tubes. Women should talk about whether to remove the  uterus (hysterectomy) with the doctor who will do the surgery. Hormone replacement therapy could be considered based on the physician's discretion. Consider referral to fertility specialist to discuss age related fertility considerations  This information is based on current understanding of the gene and may change in the future.    IMPLICATIONS FOR FAMILY MEMBERS: Individuals with BRCA2 are encouraged to share information about their genetic test results with family members. A family letter will be provided to help share this result with relatives. [facility] screening is a systematic process through which additional family members with a BRCA2 mutation are identified, starting with all first degree relatives over the age of 49 (parents, siblings, and children) of people who have a BRCA2 mutation.   Hereditary predisposition to cancer due to pathogenic variants  in the BRCA2 gene has autosomal dominant inheritance. This means that an individual with a pathogenic variant has a 50% chance of passing the condition on to their children. Identification of a pathogenic variant allows for the recognition of at-risk relatives who can pursue testing for the familial variant.  Family members are encouraged to consider genetic testing for this familial pathogenic variant. As there are generally no childhood cancer risks associated with pathogenic variants in the BRCA2 gene, individuals in the family are not typically recommended to have testing until they reach at least 48 years of age. They may contact our office at [phone] for more information or to schedule an appointment. Family members who live outside of the area are encouraged to find a genetic counselor in their area by visiting: [email or web address].  Additionally, for any family members who are of childbearing age, identification of a BRCA2 mutation may have reproductive implications. In rare cases a child may inherit a BRCA2  mutation from each parent and have a condition called Fanconi Anemia (FA). FA is a childhood onset condition that can be associated with developmental differences, childhood cancer and bone marrow failure, among other health concerns. Therefore, if anyone of childbearing age is found to have the familial BRCA2 mutation, testing of their reproductive partner should be considered to clarify the chance to have a child with FA.    For individuals with an identified gene mutation, reproductive options are available including testing on-going pregnancies, testing children when they are of age, or IVF with the option to test embryos for mutations already found in the family. These options may be explored in greater detail with a reproductive or prenatal genetic counselor for more details.  RESOURCES: Facing Our Risk of Cancer Empowered (FORCE): www.facingourrisk.org   [facility] Inherited Cancer Registry: https://inheritedcancer.net/  ADDITIONAL TESTING: Tanner Carroll meets NCCN criteria to consider additional genetic testing with a pan-cancer panel. This could evaluate for other genes associated with breast, pancreatic, and melanoma skin cancers. Declined referral for further counseling and testing at this time.   PLAN:  Tanner Carroll will discuss results of testing with his urologist, scheduled for 12/27/24. Qualifies for annual PSA starting now.   Tanner Carroll will discuss option for pancreatic cancer surveillance with his PCP and GI providers. Qualifies to consider pancreatic cancer screening at age 31, or 3 years younger than the earliest diagnosis of exocrine pancreatic cancer in a close relative.   Continue annual skin checks with dermatology.   Tanner Carroll will discuss option for breast cancer surveillance with his PCP.   Results will be shared with Tanner Carroll primary care provider, Alvia Bring, DO, for further management.   Genetic test results should be shared with relatives, who can consider undergoing  genetic testing for the BRCA2  pathogenic variant.   Lastly, we encouraged Tanner Carroll to remain in contact with [facility]'s genetics team so that we can continuously update the family history and inform him of any changes in our understanding of hereditary breast and ovarian cancer and any additional genetic testing that may be of benefit for this family.   Tanner Carroll questions were answered to his satisfaction today. Our contact information was provided should additional questions or concerns arise. Thank you for allowing us  to share in the care of your patient.   Burnard Ogren, MS, [facility] Licensed, [email or web address] phone: [phone]  45 minutes were spent on the date of the encounter in service to the patient including preparation, face-to-face consultation, documentation and care coordination.  The patient was seen alone.     I connected with  Tanner Carroll on 12/12/2024 at 1:02 pm EDT by MyChart video conference and verified that I am speaking with the correct person using two identifiers.   Patient location: home Provider location: [facility]  __________________________________________________________________ For Office Staff:  Number of people involved in session: 1 Was an Intern/ student involved with case: no

## 2024-12-23 ENCOUNTER — Encounter: Admitting: Family Medicine

## 2024-12-25 ENCOUNTER — Encounter: Payer: Self-pay | Admitting: Family Medicine

## 2024-12-25 ENCOUNTER — Ambulatory Visit: Admitting: Family Medicine

## 2024-12-25 VITALS — BP 113/77 | HR 63 | Ht 72.0 in | Wt 268.0 lb

## 2024-12-25 DIAGNOSIS — Z0000 Encounter for general adult medical examination without abnormal findings: Secondary | ICD-10-CM

## 2024-12-25 DIAGNOSIS — F1090 Alcohol use, unspecified, uncomplicated: Secondary | ICD-10-CM | POA: Diagnosis not present

## 2024-12-25 DIAGNOSIS — Z1507 Genetic susceptibility to malignant neoplasm of urinary tract: Secondary | ICD-10-CM

## 2024-12-25 DIAGNOSIS — Z1501 Genetic susceptibility to malignant neoplasm of breast: Secondary | ICD-10-CM | POA: Diagnosis not present

## 2024-12-25 DIAGNOSIS — Z1503 Genetic susceptibility to malignant neoplasm of prostate: Secondary | ICD-10-CM

## 2024-12-25 DIAGNOSIS — Z6836 Body mass index (BMI) 36.0-36.9, adult: Secondary | ICD-10-CM

## 2024-12-25 DIAGNOSIS — Z15068 Genetic susceptibility to other malignant neoplasm of digestive system: Secondary | ICD-10-CM | POA: Diagnosis not present

## 2024-12-25 DIAGNOSIS — E039 Hypothyroidism, unspecified: Secondary | ICD-10-CM | POA: Diagnosis not present

## 2024-12-25 DIAGNOSIS — Z125 Encounter for screening for malignant neoplasm of prostate: Secondary | ICD-10-CM | POA: Diagnosis not present

## 2024-12-25 DIAGNOSIS — F411 Generalized anxiety disorder: Secondary | ICD-10-CM

## 2024-12-25 DIAGNOSIS — E66812 Obesity, class 2: Secondary | ICD-10-CM | POA: Diagnosis not present

## 2024-12-25 DIAGNOSIS — Z1589 Genetic susceptibility to other disease: Secondary | ICD-10-CM

## 2024-12-25 DIAGNOSIS — Z1509 Genetic susceptibility to other malignant neoplasm: Secondary | ICD-10-CM

## 2024-12-25 DIAGNOSIS — E669 Obesity, unspecified: Secondary | ICD-10-CM | POA: Insufficient documentation

## 2024-12-25 MED ORDER — TIRZEPATIDE 10 MG/0.5ML ~~LOC~~ SOAJ: SUBCUTANEOUS | 6 refills | Status: AC

## 2024-12-25 NOTE — Assessment & Plan Note (Signed)
 Updated PSA ordered.  He will schedule appointment with clinic for pancreatic cancer screening.

## 2024-12-25 NOTE — Assessment & Plan Note (Signed)
Continue Lexapro at current strength. 

## 2024-12-25 NOTE — Assessment & Plan Note (Signed)
Update TSH today.

## 2024-12-25 NOTE — Assessment & Plan Note (Signed)
 Doing well with no troxidone at current strength.  Will plan to continue.

## 2024-12-25 NOTE — Progress Notes (Signed)
 " Tanner Carroll - 49 y.o. male MRN [id number]  Date of birth: 06/27/1976  Subjective Chief Complaint  Patient presents with   Annual Exam    HPI Tanner Carroll is a 49 y.o. male here today for follow-up visit.  He reports that he is doing pretty well.  He did have genetic testing revealing the BRCA2 gene testing.  There is family history of multiple cancers.  He did see urology and is having prostate cancer screening.  He would like me to continue to order this annually.  He is planning on self referring to clinic for pancreatic cancer screening.  He is doing well with compounded tours appetite.  No significant side effects from this at current strength.  Would like to try increasing to 7.5 mg weekly.  He does feel like this has helped to combat his alcohol cravings as well in addition to naltrexone .  Mood is stable with Lexapro .  Strength.  ROS:  A comprehensive ROS was completed and negative except as noted per HPI .    Allergies[1]  Past Medical History:  Diagnosis Date   Chiari malformation     Past Surgical History:  Procedure Laterality Date   ABDOMINAL SURGERY     APPENDECTOMY     COLONOSCOPY WITH PROPOFOL  N/A 10/06/2022   Procedure: COLONOSCOPY WITH PROPOFOL ;  Surgeon: Abran Norleen SAILOR, MD;  Location: [facility];  Service: Gastroenterology;  Laterality: N/A;   COSMETIC SURGERY     body lift   GASTRIC BYPASS  2007   POLYPECTOMY  10/06/2022   Procedure: POLYPECTOMY;  Surgeon: Abran Norleen SAILOR, MD;  Location: [facility];  Service: Gastroenterology;;    Social History   Socioeconomic History   Marital status: Single    Spouse name: Not on file   Number of children: Not on file   Years of education: Not on file   Highest education level: Bachelor's degree (e.g., BA, AB, BS)  Occupational History   Not on file  Tobacco Use   Smoking status: Never   Smokeless tobacco: Never  Substance and Sexual Activity   Alcohol use: Yes   Drug use: No   Sexual activity: Yes     Birth control/protection: Condom  Other Topics Concern   Not on file  Social History Narrative   Not on file   Social Drivers of Health   Tobacco Use: Low Risk (12/25/2024)   Patient History    Smoking Tobacco Use: Never    Smokeless Tobacco Use: Never    Passive Exposure: Not on file  Recent Concern: Tobacco Use - Medium Risk (12/18/2024)   Received from [facility]   Patient History    Smoking Tobacco Use: Former    Smokeless Tobacco Use: Former    Passive Exposure: Not on Actuary Strain: Low Risk (12/19/2024)   Overall Financial Resource Strain (CARDIA)    Difficulty of Paying Living Expenses: Not hard at all  Food Insecurity: No Food Insecurity (12/19/2024)   Epic    Worried About Radiation Protection Practitioner of Food in the Last Year: Never true    Ran Out of Food in the Last Year: Never true  Transportation Needs: No Transportation Needs (12/19/2024)   Epic    Lack of Transportation (Medical): No    Lack of Transportation (Non-Medical): No  Physical Activity: Insufficiently Active (12/19/2024)   Exercise Vital Sign    Days of Exercise per Week: 4 days    Minutes of Exercise per Session: 20 min  Stress: No Stress Concern Present (12/19/2024)   [employer] of Occupational Health - Occupational Stress Questionnaire    Feeling of Stress: Only a little  Social Connections: Socially Isolated (12/19/2024)   Social Connection and Isolation Panel    Frequency of Communication with Friends and Family: More than three times a week    Frequency of Social Gatherings with Friends and Family: Once a week    Attends Religious Services: Never    Database Administrator or Organizations: No    Attends Engineer, Structural: Not on file    Marital Status: Never married  Depression (PHQ2-9): Low Risk (12/25/2024)   Depression (PHQ2-9)    PHQ-2 Score: 0  Alcohol Screen: Low Risk (12/19/2024)   Alcohol Screen    Last Alcohol Screening Score (AUDIT): 2  Housing: Low Risk  (12/19/2024)   Epic    Unable to Pay for Housing in the Last Year: No    Number of Times Moved in the Last Year: 0    Homeless in the Last Year: No  Utilities: Not on file  Health Literacy: Not on file    Family History  Problem Relation Age of Onset   Breast cancer Mother 85 - 10   Melanoma Mother 22 - 64       anal melanoma, mets to brain   Pancreatic cancer Maternal Grandfather        GI cancer? unsure of specifics   Lung cancer Maternal Grandmother    Kidney cancer Maternal Uncle 51 - 59   Prostate cancer Maternal Uncle 71   BRCA 1/2 Maternal Uncle    BRCA 1/2 Maternal Cousin    Colon cancer Neg Hx    Stomach cancer Neg Hx    Esophageal cancer Neg Hx     Health Maintenance  Topic Date Due   HIV Screening  Never done   Hepatitis C Screening  Never done   Hepatitis B Vaccines 19-59 Average Risk (1 of 3 - 19+ 3-dose series) Never done   COVID-19 Vaccine (5 - 2025-26 season) 01/10/2025 (Originally 07/22/2024)   DTaP/Tdap/Td (2 - Td or Tdap) 09/20/2025   Colonoscopy  10/06/2032   Influenza Vaccine  Completed   HPV VACCINES (No Doses Required) Completed   Pneumococcal Vaccine  Aged Out   Meningococcal B Vaccine  Aged Out     ----------------------------------------------------------------------------------------------------------------------------------------------------------------------------------------------------------------- Physical Exam BP 113/77   Pulse 63   Ht 6' (1.829 m)   Wt 268 lb (121.6 kg)   SpO2 99%   BMI 36.35 kg/m   Physical Exam Constitutional:      Appearance: Normal appearance.  HENT:     Head: Normocephalic and atraumatic.  Eyes:     General: No scleral icterus. Cardiovascular:     Rate and Rhythm: Normal rate and regular rhythm.  Pulmonary:     Effort: Pulmonary effort is normal.     Breath sounds: Normal breath sounds.  Musculoskeletal:     Cervical back: Neck supple.  Neurological:     Mental Status: He is alert.  Psychiatric:         Mood and Affect: Mood normal.        Behavior: Behavior normal.     ------------------------------------------------------------------------------------------------------------------------------------------------------------------------------------------------------------------- Assessment and Plan  Acquired hypothyroidism Update TSH today.  Alcohol use disorder Doing well with no troxidone at current strength.  Will plan to continue.  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  He will schedule appointment with clinic for pancreatic cancer screening.  GAD (generalized anxiety disorder) Continue Lexapro  at current strength.  Obesity Continuing to do well with compounded Ozempic time.  Will plan to continue this with increase to 7.5 mg weekly.   Meds ordered this encounter  Medications   tirzepatide  (MOUNJARO ) 10 MG/0.5ML Pen    Sig: Liposlim.  Tirzepatide /Pyridoxine/Thiamine/L-Carnitine 10mg /mL.  Inject 2.5 mg/25 units subcu weekly for 4 weeks then 5 mg/50 units subcu weekly for 4 weeks then 7.5 mg/75 units subcu weekly for 4 weeks then 10 mg/100 units subcu weekly for 4 weeks then 15 mg/150 units subcu weekly    Dispense:  6 mL    Refill:  6    No follow-ups on file.         [1]  Allergies Allergen Reactions   Ibuprofen Other (See Comments)    Gastric bypass surgery, cannot have   Nsaids Other (See Comments)    Gastric bypass surgery, cannot have   "

## 2024-12-25 NOTE — Assessment & Plan Note (Signed)
 Continuing to do well with compounded Ozempic time.  Will plan to continue this with increase to 7.5 mg weekly.

## 2024-12-26 LAB — CBC WITH DIFFERENTIAL/PLATELET
Basophils Absolute: 0.1 10*3/uL (ref 0.0–0.2)
Basos: 1 %
EOS (ABSOLUTE): 0.5 10*3/uL — ABNORMAL HIGH (ref 0.0–0.4)
Eos: 6 %
Hematocrit: 48.2 % (ref 37.5–51.0)
Hemoglobin: 16 g/dL (ref 13.0–17.7)
Immature Grans (Abs): 0 10*3/uL (ref 0.0–0.1)
Immature Granulocytes: 0 %
Lymphocytes Absolute: 3 10*3/uL (ref 0.7–3.1)
Lymphs: 34 %
MCH: 29.5 pg (ref 26.6–33.0)
MCHC: 33.2 g/dL (ref 31.5–35.7)
MCV: 89 fL (ref 79–97)
Monocytes Absolute: 0.6 10*3/uL (ref 0.1–0.9)
Monocytes: 7 %
Neutrophils Absolute: 4.6 10*3/uL (ref 1.4–7.0)
Neutrophils: 52 %
Platelets: 281 10*3/uL (ref 150–450)
RBC: 5.42 x10E6/uL (ref 4.14–5.80)
RDW: 13.3 % (ref 11.6–15.4)
WBC: 8.8 10*3/uL (ref 3.4–10.8)

## 2024-12-26 LAB — CMP14+EGFR
ALT: 23 [IU]/L (ref 0–44)
AST: 26 [IU]/L (ref 0–40)
Albumin: 4.8 g/dL (ref 4.1–5.1)
Alkaline Phosphatase: 96 [IU]/L (ref 47–123)
BUN/Creatinine Ratio: 17 (ref 9–20)
BUN: 13 mg/dL (ref 6–24)
Bilirubin Total: 0.4 mg/dL (ref 0.0–1.2)
CO2: 18 mmol/L — ABNORMAL LOW (ref 20–29)
Calcium: 10 mg/dL (ref 8.7–10.2)
Chloride: 105 mmol/L (ref 96–106)
Creatinine, Ser: 0.75 mg/dL — ABNORMAL LOW (ref 0.76–1.27)
Globulin, Total: 2.6 g/dL (ref 1.5–4.5)
Glucose: 83 mg/dL (ref 70–99)
Potassium: 5.1 mmol/L (ref 3.5–5.2)
Sodium: 145 mmol/L — ABNORMAL HIGH (ref 134–144)
Total Protein: 7.4 g/dL (ref 6.0–8.5)
eGFR: 112 mL/min/{1.73_m2}

## 2024-12-26 LAB — LIPID PANEL WITH LDL/HDL RATIO
Cholesterol, Total: 199 mg/dL (ref 100–199)
HDL: 63 mg/dL
LDL Chol Calc (NIH): 123 mg/dL — ABNORMAL HIGH (ref 0–99)
LDL/HDL Ratio: 2 ratio (ref 0.0–3.6)
Triglycerides: 69 mg/dL (ref 0–149)
VLDL Cholesterol Cal: 13 mg/dL (ref 5–40)

## 2024-12-26 LAB — TSH: TSH: 2.38 u[IU]/mL (ref 0.450–4.500)

## 2024-12-26 LAB — PSA: Prostate Specific Ag, Serum: 0.8 ng/mL (ref 0.0–4.0)

## 2025-02-17 ENCOUNTER — Encounter: Admitting: Family Medicine
